# Patient Record
Sex: Male | Born: 1975 | Race: White | Hispanic: No | Marital: Married | Smoking: Never smoker
Health system: Southern US, Community
[De-identification: ages and names within clinical notes are randomized; demographics above are authoritative.]

## PROBLEM LIST (undated history)

## (undated) DIAGNOSIS — F329 Major depressive disorder, single episode, unspecified: Secondary | ICD-10-CM

## (undated) DIAGNOSIS — F32A Depression, unspecified: Secondary | ICD-10-CM

## (undated) DIAGNOSIS — E785 Hyperlipidemia, unspecified: Secondary | ICD-10-CM

## (undated) DIAGNOSIS — F419 Anxiety disorder, unspecified: Secondary | ICD-10-CM

## (undated) DIAGNOSIS — Z8619 Personal history of other infectious and parasitic diseases: Secondary | ICD-10-CM

## (undated) HISTORY — DX: Personal history of other infectious and parasitic diseases: Z86.19

## (undated) HISTORY — DX: Depression, unspecified: F32.A

## (undated) HISTORY — DX: Major depressive disorder, single episode, unspecified: F32.9

## (undated) HISTORY — PX: WISDOM TOOTH EXTRACTION: SHX21

## (undated) HISTORY — DX: Hyperlipidemia, unspecified: E78.5

## (undated) HISTORY — DX: Anxiety disorder, unspecified: F41.9

---

## 2010-02-11 ENCOUNTER — Telehealth: Payer: Self-pay | Admitting: Family Medicine

## 2010-02-11 ENCOUNTER — Other Ambulatory Visit: Payer: Self-pay | Admitting: Family Medicine

## 2010-02-11 ENCOUNTER — Ambulatory Visit
Admission: RE | Admit: 2010-02-11 | Discharge: 2010-02-11 | Payer: Self-pay | Source: Home / Self Care | Attending: Family Medicine | Admitting: Family Medicine

## 2010-02-11 ENCOUNTER — Encounter: Payer: Self-pay | Admitting: Family Medicine

## 2010-02-11 DIAGNOSIS — J309 Allergic rhinitis, unspecified: Secondary | ICD-10-CM | POA: Insufficient documentation

## 2010-02-11 DIAGNOSIS — F411 Generalized anxiety disorder: Secondary | ICD-10-CM | POA: Insufficient documentation

## 2010-02-11 LAB — CBC WITH DIFFERENTIAL/PLATELET
Basophils Absolute: 0 10*3/uL (ref 0.0–0.1)
Basophils Relative: 0.5 % (ref 0.0–3.0)
Eosinophils Absolute: 0.1 10*3/uL (ref 0.0–0.7)
Eosinophils Relative: 1.8 % (ref 0.0–5.0)
HCT: 46.3 % (ref 39.0–52.0)
Hemoglobin: 15.9 g/dL (ref 13.0–17.0)
Lymphocytes Relative: 26.7 % (ref 12.0–46.0)
Lymphs Abs: 1.8 10*3/uL (ref 0.7–4.0)
MCHC: 34.4 g/dL (ref 30.0–36.0)
MCV: 89.6 fl (ref 78.0–100.0)
Monocytes Absolute: 0.5 10*3/uL (ref 0.1–1.0)
Monocytes Relative: 6.9 % (ref 3.0–12.0)
Neutro Abs: 4.4 10*3/uL (ref 1.4–7.7)
Neutrophils Relative %: 64.1 % (ref 43.0–77.0)
Platelets: 169 10*3/uL (ref 150.0–400.0)
RBC: 5.16 Mil/uL (ref 4.22–5.81)
RDW: 13 % (ref 11.5–14.6)
WBC: 6.9 10*3/uL (ref 4.5–10.5)

## 2010-02-11 LAB — BASIC METABOLIC PANEL
BUN: 15 mg/dL (ref 6–23)
CO2: 30 mEq/L (ref 19–32)
Calcium: 9.6 mg/dL (ref 8.4–10.5)
Chloride: 104 mEq/L (ref 96–112)
Creatinine, Ser: 1 mg/dL (ref 0.4–1.5)
GFR: 93.48 mL/min (ref >60.00)
Glucose, Bld: 92 mg/dL (ref 70–99)
Potassium: 4.2 mEq/L (ref 3.5–5.1)
Sodium: 140 mEq/L (ref 135–145)

## 2010-02-11 LAB — HEPATIC FUNCTION PANEL
ALT: 97 U/L — ABNORMAL HIGH (ref 0–53)
AST: 43 U/L — ABNORMAL HIGH (ref 0–37)
Albumin: 4.2 g/dL (ref 3.5–5.2)
Alkaline Phosphatase: 61 U/L (ref 39–117)
Bilirubin, Direct: 0.2 mg/dL (ref 0.0–0.3)
Total Bilirubin: 1.4 mg/dL — ABNORMAL HIGH (ref 0.3–1.2)
Total Protein: 7.4 g/dL (ref 6.0–8.3)

## 2010-02-11 LAB — LIPID PANEL
Cholesterol: 232 mg/dL — ABNORMAL HIGH (ref 0–200)
HDL: 52.9 mg/dL (ref >39.00)
Total CHOL/HDL Ratio: 4
Triglycerides: 108 mg/dL (ref 0.0–149.0)
VLDL: 21.6 mg/dL (ref 0.0–40.0)

## 2010-02-11 LAB — LDL CHOLESTEROL, DIRECT: Direct LDL: 166.5 mg/dL

## 2010-02-11 LAB — TSH: TSH: 1.33 u[IU]/mL (ref 0.35–5.50)

## 2010-02-23 ENCOUNTER — Telehealth: Payer: Self-pay | Admitting: Family Medicine

## 2010-03-09 ENCOUNTER — Ambulatory Visit
Admission: RE | Admit: 2010-03-09 | Discharge: 2010-03-09 | Payer: Self-pay | Source: Home / Self Care | Attending: Family Medicine | Admitting: Family Medicine

## 2010-03-09 DIAGNOSIS — E785 Hyperlipidemia, unspecified: Secondary | ICD-10-CM | POA: Insufficient documentation

## 2010-03-12 NOTE — Assessment & Plan Note (Signed)
Summary: BRAND NEW PT/TO EST/PT REQ CPX/PT FASTING/CJR   Vital Signs:  Patient profile:   35 year old male Height:      71.5 inches Weight:      232 pounds BMI:     32.02 O2 Sat:      98 % Temp:     97.8 degrees F Pulse rate:   78 / minute BP sitting:   128 / 80  (left arm) Cuff size:   large  Vitals Entered By: Pura Spice, RN (February 11, 2010 8:55 AM) CC: new to est. requesting cpx  fasting    History of Present Illness: 35 yr old male to establish with [place] and for a cpx. He moved to [facility] from [place] about one year ago. He knows he is overweight, he has a poor diet, and he does not exercise. He feels well physically, but he struglles with a lot of anxiety and spome depression. He has always been a an anxious person, but in the past year this has been more of a problem. His job is stressful, and it makes him worry a lot, he can't relax, and he has a short temper. He sleeps well. He denies any suicidal thoughts. He has a happy marriage, and he enjoys his 2 young children.     Preventive Screening-Counseling & Management  Alcohol-Tobacco     Smoking Status: never  Allergies (verified): No Known Drug Allergies  Past History:  Past Medical History: Allergic rhinitis Anxiety  Past Surgical History: Denies surgical history  Family History: Reviewed history and no changes required. Family History of Alcoholism/Addiction Family History of Arthritis Family History of CAD Male 1st degree relative <60 Family History of Colon CA 1st degree relative <60 Family History Depression Family History Lung cancer  Social History: Reviewed history and no changes required. Occupation:  attorney in Engineer, maintenance (IT) estate  Married Never Smoked, but chewed tobacco in the past  Alcohol use-yes Smoking Status:  never Occupation:  employed  Review of Systems  The patient denies anorexia, fever, weight loss, vision loss, decreased hearing, hoarseness, chest pain,  syncope, dyspnea on exertion, peripheral edema, prolonged cough, headaches, hemoptysis, abdominal pain, melena, hematochezia, severe indigestion/heartburn, hematuria, incontinence, genital sores, muscle weakness, suspicious skin lesions, transient blindness, difficulty walking, unusual weight change, abnormal bleeding, enlarged lymph nodes, angioedema, breast masses, and testicular masses.    Physical Exam  General:  overweight-appearing.   Head:  Normocephalic and atraumatic without obvious abnormalities. No apparent alopecia or balding. Eyes:  No corneal or conjunctival inflammation noted. EOMI. Perrla. Funduscopic exam benign, without hemorrhages, exudates or papilledema. Vision grossly normal. Ears:  External ear exam shows no significant lesions or deformities.  Otoscopic examination reveals clear canals, tympanic membranes are intact bilaterally without bulging, retraction, inflammation or discharge. Hearing is grossly normal bilaterally. Nose:  External nasal examination shows no deformity or inflammation. Nasal mucosa are pink and moist without lesions or exudates. Mouth:  Oral mucosa and oropharynx without lesions or exudates.  Teeth in good repair. Chest Wall:  No deformities, masses, tenderness or gynecomastia noted. Lungs:  Normal respiratory effort, chest expands symmetrically. Lungs are clear to auscultation, no crackles or wheezes. Heart:  Normal rate and regular rhythm. S1 and S2 normal without gallop, murmur, click, rub or other extra sounds. Abdomen:  Bowel sounds positive,abdomen soft and non-tender without masses, organomegaly or hernias noted. Genitalia:  Testes bilaterally descended without nodularity, tenderness or masses. No scrotal masses or lesions. No penis lesions or  urethral discharge. Msk:  No deformity or scoliosis noted of thoracic or lumbar spine.   Pulses:  R and L carotid,radial,femoral,dorsalis pedis and posterior tibial pulses are full and equal  bilaterally Extremities:  No clubbing, cyanosis, edema, or deformity noted with normal full range of motion of all joints.   Neurologic:  No cranial nerve deficits noted. Station and gait are normal. Plantar reflexes are down-going bilaterally. DTRs are symmetrical throughout. Sensory, motor and coordinative functions appear intact. Skin:  Intact without suspicious lesions or rashes Cervical Nodes:  No lymphadenopathy noted Axillary Nodes:  No palpable lymphadenopathy Inguinal Nodes:  No significant adenopathy Psych:  Oriented X3, memory intact for recent and remote, normally interactive, good eye contact, and moderately anxious.     Impression & Recommendations:  Problem # 1:  HEALTH MAINTENANCE EXAM (ICD-V70.0)  Orders: UA Dipstick w/o Micro (automated)  (81003) Venipuncture (16109) TLB-Lipid Panel (80061-LIPID) TLB-BMP (Basic Metabolic Panel-BMET) (80048-METABOL) TLB-CBC Platelet - w/Differential (85025-CBCD) TLB-Hepatic/Liver Function Pnl (80076-HEPATIC) TLB-TSH (Thyroid Stimulating Hormone) (84443-TSH)  Problem # 2:  ANXIETY (ICD-300.00)  His updated medication list for this problem includes:    Zoloft 50 Mg Tabs (Sertraline hcl) ..... Once daily  Complete Medication List: 1)  Zoloft 50 Mg Tabs (Sertraline hcl) .... Once daily  Other Orders: Tdap => 48yrs IM (60454) Admin 1st Vaccine (09811)  Patient Instructions: 1)  Start on Zoloft and recheck in 3 weeks.  2)  Get fasting labs  3)  It is important that you exercise reguarly at least 20 minutes 5 times a week. If you develop chest pain, have severe difficulty breathing, or feel very tired, stop exercising immediately and seek medical attention.  4)  You need to lose weight. Consider a lower calorie diet and regular exercise.  Prescriptions: ZOLOFT 50 MG TABS (SERTRALINE HCL) once daily  #30 x 5   Entered and Authorized by:   Nelwyn Salisbury MD   Signed by:   Nelwyn Salisbury MD on 02/11/2010   Method used:   Print then  Give to Patient   RxID:   [id number]    Orders Added: 1)  Tdap => 43yrs IM [78469] 2)  Admin 1st Vaccine [90471] 3)  New Patient 18-39 years [99385] 4)  UA Dipstick w/o Micro (automated)  [81003] 5)  Venipuncture [36415] 6)  TLB-Lipid Panel [80061-LIPID] 7)  TLB-BMP (Basic Metabolic Panel-BMET) [80048-METABOL] 8)  TLB-CBC Platelet - w/Differential [85025-CBCD] 9)  TLB-Hepatic/Liver Function Pnl [80076-HEPATIC] 10)  TLB-TSH (Thyroid Stimulating Hormone) [62952-WUX]   Immunization History:  Influenza Immunization History:    Influenza:  historical (11/08/2009)  Immunizations Administered:  Tetanus Vaccine:    Vaccine Type: Tdap    Site: left deltoid    Mfr: GlaxoSmithKline    Dose: 0.5 ml    Route: IM    Given by: Pura Spice, RN    Exp. Date: 11/28/2011    Lot #: [id number]    VIS given: 12/27/07 version given February 11, 2010.   Immunization History:  Influenza Immunization History:    Influenza:  Historical (11/08/2009)  Immunizations Administered:  Tetanus Vaccine:    Vaccine Type: Tdap    Site: left deltoid    Mfr: GlaxoSmithKline    Dose: 0.5 ml    Route: IM    Given by: Pura Spice, RN    Exp. Date: 11/28/2011    Lot #: [id number]    VIS given: 12/27/07 version given February 11, 2010.  Appended Document: BRAND NEW PT/TO EST/PT REQ  CPX/PT FASTING/CJR  Laboratory Results   Urine Tests    Routine Urinalysis   Color: yellow Appearance: Clear Glucose: negative   (Normal Range: Negative) Bilirubin: negative   (Normal Range: Negative) Ketone: negative   (Normal Range: Negative) Spec. Gravity: 1.020   (Normal Range: 1.003-1.035) Blood: 1+.sign   (Normal Range: Negative) pH: 6.0   (Normal Range: 5.0-8.0) Protein: negative   (Normal Range: Negative) Urobilinogen: 0.2   (Normal Range: 0-1) Nitrite: negative   (Normal Range: Negative) Leukocyte Esterace: negative   (Normal Range: Negative)

## 2010-03-12 NOTE — Letter (Signed)
Summary: Patient Information Form  Patient Information Form   Imported By: Maryln Gottron 02/13/2010 09:04:35  _____________________________________________________________________  External Attachment:    Type:   Image     Comment:   External Document

## 2010-03-12 NOTE — Progress Notes (Signed)
Summary: Going Forward for [employer] Note Call from Patient   Summary of Call: GOING FORWARD PT. WOULD LIKE REFILLS TO BE CALLED INTO [facility] ON [address].Thanks! Initial call taken by: Georgian Co,  February 11, 2010 10:32 AM  Follow-up for Phone Call        ? what refill  Follow-up by: Pura Spice, RN,  February 18, 2010 8:47 AM  Additional Follow-up for Phone Call Additional follow up Details #1::        spoke with pt states dr Amali Uhls gave him rx for zoloft and wasn't sure what pharmacy so he took the rx to his drug  states he has 3 wk follow up  appt on Mar 08 2010. Additional Follow-up by: Pura Spice, RN,  February 18, 2010 9:31 AM

## 2010-03-12 NOTE — Progress Notes (Signed)
Summary: questions about labs  Phone Note Call from Patient   Caller: Patient Call For: Gina Summary of Call: Pt would like to speak to Almira Coaster again regardng his lab results. [phone] Initial call taken by: [facility] CMA AAMA,  February 23, 2010 9:35 AM  Follow-up for Phone Call        spoke with pt to make appt in 3 months  dr fry aware  Follow-up by: Pura Spice, RN,  February 24, 2010 8:52 AM

## 2010-03-18 NOTE — Assessment & Plan Note (Signed)
Summary: 3 week f/u//alp/rsc per Gina/cjr/pt rsc/cjr   Vital Signs:  Patient profile:   35 year old male O2 Sat:      97 % Temp:     97.7 degrees F Pulse rate:   79 / minute BP sitting:   120 / 78  (left arm) Cuff size:   large  Vitals Entered By: Pura Spice, RN (March 09, 2010 8:29 AM) CC: 3 wk f/u with zoloft and wants to discuss labs  needs rx to [facility]   History of Present Illness: Here to follow up on anxiety and depression. For the past 3 weeks he has been taking Zoloft 50 mg a day, and he thinks it is helping. He is a bit more relexed than before, but is not quite where he wants to be yet. No side effects.   Allergies: No Known Drug Allergies  Past History:  Past Medical History: chicken pox  Allergic rhinitis Anxiety Hyperlipidemia  Review of Systems  The patient denies anorexia, fever, weight loss, weight gain, vision loss, decreased hearing, hoarseness, chest pain, syncope, dyspnea on exertion, peripheral edema, prolonged cough, headaches, hemoptysis, abdominal pain, melena, hematochezia, severe indigestion/heartburn, hematuria, incontinence, genital sores, muscle weakness, suspicious skin lesions, transient blindness, difficulty walking, depression, unusual weight change, abnormal bleeding, enlarged lymph nodes, angioedema, breast masses, and testicular masses.    Physical Exam  General:  Well-developed,well-nourished,in no acute distress; alert,appropriate and cooperative throughout examination Psych:  Cognition and judgment appear intact. Alert and cooperative with normal attention span and concentration. No apparent delusions, illusions, hallucinations   Impression & Recommendations:  Problem # 1:  ANXIETY (ICD-300.00)  His updated medication list for this problem includes:    Zoloft 50 Mg Tabs (Sertraline hcl) .Marland Kitchen... 2 tabs once daily  Complete Medication List: 1)  Zoloft 50 Mg Tabs (Sertraline hcl) .... 2 tabs once daily  Patient Instructions: 1)   We spent 30 minutes discussing this, and we decided to increase the dose to 100 mg a day. He will call back in 2 weeks.    Orders Added: 1)  Est. Patient Level IV [16109]

## 2010-03-24 ENCOUNTER — Telehealth: Payer: Self-pay | Admitting: *Deleted

## 2010-03-24 MED ORDER — SERTRALINE HCL 50 MG PO TABS: 50.0000 mg | ORAL_TABLET | Freq: Every day | ORAL | Status: DC

## 2010-03-24 NOTE — Telephone Encounter (Signed)
I sent in a rx for 50 mg tablets. Take one 100 mg and one 50 mg tablet every day.

## 2010-03-24 NOTE — Telephone Encounter (Signed)
Zoloft 100 mg, and is feeling ok but no real difference in the 100 mg vs the 50 mg.   Anxiety is some better. Would be interested in trying the 150 mg. Please call to [facility] ([address])

## 2010-03-26 ENCOUNTER — Telehealth: Payer: Self-pay | Admitting: Family Medicine

## 2010-03-26 DIAGNOSIS — F419 Anxiety disorder, unspecified: Secondary | ICD-10-CM

## 2010-03-26 NOTE — Telephone Encounter (Signed)
Triage vm--------pt spoke with a nurse on Tuesday about a rx? Went to pharmacy today to pick up rx. Dosage is wrong. It's supposed to be for 150mg .  rx says 50mg . Please return call.

## 2010-03-27 MED ORDER — SERTRALINE HCL 100 MG PO TABS: 100.0000 mg | ORAL_TABLET | Freq: Every day | ORAL | Status: DC

## 2010-03-27 MED ORDER — SERTRALINE HCL 50 MG PO TABS: 50.0000 mg | ORAL_TABLET | Freq: Every day | ORAL | Status: DC

## 2010-03-27 NOTE — Telephone Encounter (Signed)
Spoke with pt  Wants the zoloft  100 mg tablet and 50 mg tablet  to [facility] 3 month supply Ok per dr fry.  Pt aware will send to [facility]

## 2010-03-27 NOTE — Telephone Encounter (Signed)
I sent in a rx for Zoloft 100 mg , so he can take one 50 and one 100 every day (a total of 150 mg) . Please tell the pt.

## 2010-04-02 ENCOUNTER — Other Ambulatory Visit: Payer: Self-pay | Admitting: Family Medicine

## 2010-04-02 NOTE — Telephone Encounter (Signed)
Pt called to ck on status of his Rx for setraline / zoloft  That was supposed to be sent to [facility].... Pt # [phone].

## 2010-04-02 NOTE — Telephone Encounter (Signed)
Left mess on pt cell phone these meds were sent to [facility] on 03-27-2010 and to ck with [facility] or have them call /fax [place].

## 2010-04-03 NOTE — Telephone Encounter (Signed)
No return call back from pt  As of today

## 2010-05-04 ENCOUNTER — Telehealth: Payer: Self-pay | Admitting: *Deleted

## 2010-05-04 NOTE — Telephone Encounter (Signed)
Pt has questions to [facility] about drug interactions that Dr. Clent Ridges gave him.

## 2010-05-04 NOTE — Telephone Encounter (Addendum)
Spoke with pt wanted to know if could take sudafed  with zoloft. Pet dr Scotty Court ok  Pt aware.

## 2010-10-05 ENCOUNTER — Encounter: Payer: Self-pay | Admitting: Family Medicine

## 2010-10-05 ENCOUNTER — Ambulatory Visit (INDEPENDENT_AMBULATORY_CARE_PROVIDER_SITE_OTHER): Payer: BC Managed Care – PPO | Admitting: Family Medicine

## 2010-10-05 ENCOUNTER — Ambulatory Visit (INDEPENDENT_AMBULATORY_CARE_PROVIDER_SITE_OTHER)
Admission: RE | Admit: 2010-10-05 | Discharge: 2010-10-05 | Disposition: A | Discharge status: Home / Self Care | Payer: BC Managed Care – PPO | Source: Ambulatory Visit | Attending: Family Medicine | Admitting: Family Medicine

## 2010-10-05 VITALS — BP 130/84 | HR 83 | Temp 98.7°F | Wt 234.0 lb

## 2010-10-05 DIAGNOSIS — S92919A Unspecified fracture of unspecified toe(s), initial encounter for closed fracture: Secondary | ICD-10-CM

## 2010-10-05 NOTE — Progress Notes (Signed)
  Subjective:    Patient ID: Howard Jacobs, male    DOB: 1975/09/15, 35 y.o.   MRN: [id number]  HPI Here for an injury to the left 5th toe yesterday. While cleaning his garage with flip flops on, he stibbed the toe on the corner of a box on the floor. It has been painful since then.    Review of Systems  Constitutional: Negative.   Musculoskeletal: Positive for joint swelling.       Objective:   Physical Exam  Constitutional: He appears well-developed and well-nourished.  Musculoskeletal:       The left 5th toe is quite ecchymotic, swollen, and tender. No crepitus. Good alignment.           Assessment & Plan:  Probable toe fracture. Wear supportive shoes. Use Motrin and ice for pain. Get Xrays today

## 2010-10-07 ENCOUNTER — Telehealth: Payer: Self-pay | Admitting: Family Medicine

## 2010-10-07 NOTE — Telephone Encounter (Signed)
Left voice message with results.

## 2010-10-07 NOTE — Telephone Encounter (Signed)
Message copied by Baldemar Friday on Wed Oct 07, 2010 11:28 AM ------      Message from: Gershon Crane A      Created: Mon Oct 05, 2010  5:14 PM       Normal. I think he had a crack in the toe that was too small to see on the xray. This should heal up quickly. Recheck prn

## 2010-10-09 ENCOUNTER — Telehealth: Payer: Self-pay | Admitting: *Deleted

## 2010-10-09 NOTE — Telephone Encounter (Signed)
Zoloft is making pt sleepy in am hours??  Can he change dosages?

## 2010-10-09 NOTE — Telephone Encounter (Signed)
Try taking it at night

## 2010-10-13 NOTE — Telephone Encounter (Signed)
Left message on machine for patient

## 2010-11-25 ENCOUNTER — Telehealth: Payer: Self-pay | Admitting: Family Medicine

## 2010-11-25 DIAGNOSIS — F419 Anxiety disorder, unspecified: Secondary | ICD-10-CM

## 2010-11-25 NOTE — Telephone Encounter (Signed)
Pt called re: script for sertraline (ZOLOFT). Pt is taking 150 mg per day per Dr Clent Ridges. Pt is req that Dr Clent Ridges writes script for 150mg  for 90 day supply and send to [facility].

## 2010-11-26 MED ORDER — SERTRALINE HCL 100 MG PO TABS: 100.0000 mg | ORAL_TABLET | Freq: Every day | ORAL | Status: DC

## 2010-11-26 MED ORDER — SERTRALINE HCL 50 MG PO TABS: 50.0000 mg | ORAL_TABLET | Freq: Every day | ORAL | Status: DC

## 2010-11-26 NOTE — Telephone Encounter (Signed)
done

## 2011-02-12 ENCOUNTER — Telehealth: Payer: Self-pay | Admitting: Family Medicine

## 2011-02-12 NOTE — Telephone Encounter (Signed)
Pt need cpx by 03-11-2011. Can I create 30 min slot?

## 2011-02-12 NOTE — Telephone Encounter (Signed)
Okay to work in

## 2011-02-15 NOTE — Telephone Encounter (Signed)
Pt is sch for 02-25-2011 pt will come in fasting

## 2011-02-24 ENCOUNTER — Encounter: Payer: Self-pay | Admitting: Family Medicine

## 2011-02-25 ENCOUNTER — Encounter: Payer: Self-pay | Admitting: Family Medicine

## 2011-02-25 ENCOUNTER — Ambulatory Visit (INDEPENDENT_AMBULATORY_CARE_PROVIDER_SITE_OTHER): Payer: BC Managed Care – PPO | Admitting: Family Medicine

## 2011-02-25 VITALS — BP 130/86 | HR 74 | Temp 98.4°F | Ht 70.75 in | Wt 240.0 lb

## 2011-02-25 DIAGNOSIS — F419 Anxiety disorder, unspecified: Secondary | ICD-10-CM

## 2011-02-25 DIAGNOSIS — Z0000 Encounter for general adult medical examination without abnormal findings: Secondary | ICD-10-CM

## 2011-02-25 DIAGNOSIS — F411 Generalized anxiety disorder: Secondary | ICD-10-CM

## 2011-02-25 LAB — POCT URINALYSIS DIPSTICK
Ketones, UA: NEGATIVE
Leukocytes, UA: NEGATIVE
Protein, UA: NEGATIVE
Urobilinogen, UA: 0.2

## 2011-02-25 LAB — LIPID PANEL
Cholesterol: 205 mg/dL — ABNORMAL HIGH (ref 0–200)
HDL: 48.8 mg/dL (ref >39.00)
Triglycerides: 85 mg/dL (ref 0.0–149.0)
VLDL: 17 mg/dL (ref 0.0–40.0)

## 2011-02-25 LAB — CBC WITH DIFFERENTIAL/PLATELET
Basophils Absolute: 0 10*3/uL (ref 0.0–0.1)
Lymphocytes Relative: 27.2 % (ref 12.0–46.0)
Monocytes Relative: 9 % (ref 3.0–12.0)
Neutrophils Relative %: 61.3 % (ref 43.0–77.0)
Platelets: 154 10*3/uL (ref 150.0–400.0)
RDW: 13.1 % (ref 11.5–14.6)

## 2011-02-25 LAB — TSH: TSH: 1.78 u[IU]/mL (ref 0.35–5.50)

## 2011-02-25 LAB — BASIC METABOLIC PANEL
BUN: 16 mg/dL (ref 6–23)
Chloride: 103 mEq/L (ref 96–112)
Creatinine, Ser: 0.9 mg/dL (ref 0.4–1.5)

## 2011-02-25 LAB — HEPATIC FUNCTION PANEL
ALT: 98 U/L — ABNORMAL HIGH (ref 0–53)
Total Bilirubin: 1.2 mg/dL (ref 0.3–1.2)
Total Protein: 7.2 g/dL (ref 6.0–8.3)

## 2011-02-25 LAB — LDL CHOLESTEROL, DIRECT: Direct LDL: 145.5 mg/dL

## 2011-02-25 MED ORDER — SERTRALINE HCL 100 MG PO TABS: 100.0000 mg | ORAL_TABLET | Freq: Every day | ORAL | Status: DC

## 2011-02-25 NOTE — Progress Notes (Signed)
  Subjective:    Patient ID: Howard Jacobs, male    DOB: 09-Jan-1976, 36 y.o.   MRN: [id number]  HPI    Review of Systems     Objective:   Physical Exam        Assessment & Plan:

## 2011-02-26 NOTE — Progress Notes (Signed)
Quick Note:  Left voice message ______ 

## 2012-01-09 ENCOUNTER — Other Ambulatory Visit: Payer: Self-pay | Admitting: Family Medicine

## 2012-03-22 ENCOUNTER — Encounter: Payer: Self-pay | Admitting: Family Medicine

## 2012-03-22 ENCOUNTER — Ambulatory Visit (INDEPENDENT_AMBULATORY_CARE_PROVIDER_SITE_OTHER): Payer: BC Managed Care – PPO | Admitting: Family Medicine

## 2012-03-22 VITALS — BP 130/88 | HR 76 | Temp 98.7°F | Ht 71.0 in | Wt 234.0 lb

## 2012-03-22 DIAGNOSIS — Z0000 Encounter for general adult medical examination without abnormal findings: Secondary | ICD-10-CM

## 2012-03-22 LAB — CBC WITH DIFFERENTIAL/PLATELET
Basophils Absolute: 0 10*3/uL (ref 0.0–0.1)
Eosinophils Absolute: 0.1 10*3/uL (ref 0.0–0.7)
Eosinophils Relative: 0.9 % (ref 0.0–5.0)
Lymphs Abs: 1.7 10*3/uL (ref 0.7–4.0)
MCHC: 34.3 g/dL (ref 30.0–36.0)
MCV: 86.6 fl (ref 78.0–100.0)
Monocytes Absolute: 0.4 10*3/uL (ref 0.1–1.0)
Neutrophils Relative %: 65.9 % (ref 43.0–77.0)
Platelets: 166 10*3/uL (ref 150.0–400.0)
RDW: 12.5 % (ref 11.5–14.6)
WBC: 6.6 10*3/uL (ref 4.5–10.5)

## 2012-03-22 LAB — BASIC METABOLIC PANEL
CO2: 25 mEq/L (ref 19–32)
Calcium: 9.2 mg/dL (ref 8.4–10.5)
Creatinine, Ser: 1 mg/dL (ref 0.4–1.5)
Glucose, Bld: 90 mg/dL (ref 70–99)

## 2012-03-22 LAB — POCT URINALYSIS DIPSTICK
Protein, UA: NEGATIVE
Spec Grav, UA: 1.02
Urobilinogen, UA: 0.2
pH, UA: 6

## 2012-03-22 LAB — LIPID PANEL
HDL: 43.9 mg/dL (ref >39.00)
Triglycerides: 92 mg/dL (ref 0.0–149.0)

## 2012-03-22 LAB — HEPATIC FUNCTION PANEL
ALT: 68 U/L — ABNORMAL HIGH (ref 0–53)
AST: 36 U/L (ref 0–37)
Bilirubin, Direct: 0.1 mg/dL (ref 0.0–0.3)
Total Protein: 7.5 g/dL (ref 6.0–8.3)

## 2012-03-22 MED ORDER — SERTRALINE HCL 100 MG PO TABS: 100.0000 mg | ORAL_TABLET | Freq: Every day | ORAL | Status: DC

## 2012-03-22 NOTE — Progress Notes (Signed)
  Subjective:    Patient ID: Howard Jacobs, male    DOB: 10-13-1975, 37 y.o.   MRN: [id number]  HPI 37 yr old male for a cpx. He feels well and has no concerns.    Review of Systems  Constitutional: Negative.   HENT: Negative.   Eyes: Negative.   Respiratory: Negative.   Cardiovascular: Negative.   Gastrointestinal: Negative.   Genitourinary: Negative.   Musculoskeletal: Negative.   Skin: Negative.   Neurological: Negative.   Psychiatric/Behavioral: Negative.        Objective:   Physical Exam  Constitutional: He is oriented to person, place, and time. He appears well-developed and well-nourished. No distress.  HENT:  Head: Normocephalic and atraumatic.  Right Ear: External ear normal.  Left Ear: External ear normal.  Nose: Nose normal.  Mouth/Throat: Oropharynx is clear and moist. No oropharyngeal exudate.  There is a 1.5 cm sebaceous cyst on the occipital scalp, not tender  Eyes: Conjunctivae and EOM are normal. Pupils are equal, round, and reactive to light. Right eye exhibits no discharge. Left eye exhibits no discharge. No scleral icterus.  Neck: Neck supple. No JVD present. No tracheal deviation present. No thyromegaly present.  Cardiovascular: Normal rate, regular rhythm, normal heart sounds and intact distal pulses.  Exam reveals no gallop and no friction rub.   No murmur heard. Pulmonary/Chest: Effort normal and breath sounds normal. No respiratory distress. He has no wheezes. He has no rales. He exhibits no tenderness.  Abdominal: Soft. Bowel sounds are normal. He exhibits no distension and no mass. There is no tenderness. There is no rebound and no guarding.  Genitourinary: Rectum normal, prostate normal and penis normal. Guaiac negative stool. No penile tenderness.  Musculoskeletal: Normal range of motion. He exhibits no edema and no tenderness.  Lymphadenopathy:    He has no cervical adenopathy.  Neurological: He is alert and oriented to person, place, and time. He  has normal reflexes. No cranial nerve deficit. He exhibits normal muscle tone. Coordination normal.  Skin: Skin is warm and dry. No rash noted. He is not diaphoretic. No erythema. No pallor.  Psychiatric: He has a normal mood and affect. His behavior is normal. Judgment and thought content normal.          Assessment & Plan:  Well exam. Get fasting labs. He will observe the cyst for now and recheck prn

## 2012-04-07 ENCOUNTER — Telehealth: Payer: Self-pay | Admitting: Family Medicine

## 2012-04-07 NOTE — Telephone Encounter (Signed)
This was printed and refaxed today.

## 2012-04-07 NOTE — Telephone Encounter (Signed)
I am fairly certain I have already done this and sent it to be faxed

## 2012-04-07 NOTE — Telephone Encounter (Signed)
Pt faxed in a form from his company called [employer] to be filled out by MD. Included basic test results. Pt following up on this form. Pt asked that it be emailed back. Pls advise.

## 2012-09-15 ENCOUNTER — Encounter: Payer: Self-pay | Admitting: Family Medicine

## 2012-09-15 ENCOUNTER — Ambulatory Visit (INDEPENDENT_AMBULATORY_CARE_PROVIDER_SITE_OTHER): Payer: BC Managed Care – PPO | Admitting: Family Medicine

## 2012-09-15 VITALS — BP 140/90 | HR 82 | Temp 98.9°F | Wt 230.0 lb

## 2012-09-15 DIAGNOSIS — J029 Acute pharyngitis, unspecified: Secondary | ICD-10-CM

## 2012-09-15 MED ORDER — CEPHALEXIN 500 MG PO CAPS: 500.0000 mg | ORAL_CAPSULE | Freq: Three times a day (TID) | ORAL | Status: AC

## 2012-09-15 NOTE — Progress Notes (Signed)
  Subjective:    Patient ID: Howard Jacobs, male    DOB: [birth year], 37 y.o.   MRN: [id number]  HPI Here for 4 days of fever to 100.7 degrees, aches, HA, and a ST. No cough. Using Advil.    Review of Systems  Constitutional: Positive for fever and chills.  HENT: Positive for sore throat. Negative for congestion, postnasal drip and sinus pressure.   Eyes: Negative.   Respiratory: Negative.        Objective:   Physical Exam  Constitutional: He appears well-developed and well-nourished.  HENT:  Right Ear: External ear normal.  Left Ear: External ear normal.  Nose: Nose normal.  Mouth/Throat: No oropharyngeal exudate.  Posterior OP is red  Eyes: Conjunctivae are normal.  Neck: No thyromegaly present.  Tender nodes in the right AC chain   Pulmonary/Chest: Effort normal and breath sounds normal.          Assessment & Plan:  Probable strep throat. Treat with Keflex.

## 2012-11-14 ENCOUNTER — Telehealth: Payer: Self-pay | Admitting: Family Medicine

## 2012-11-14 NOTE — Telephone Encounter (Signed)
Pt called to request a new rx of sertraline (ZOLOFT) 100 MG tablet. He would like a 30 day supply sent to [facility] on [address]. Please assist.

## 2012-11-15 NOTE — Telephone Encounter (Signed)
Call in #[phone] with 11 rf 

## 2012-11-16 MED ORDER — SERTRALINE HCL 100 MG PO TABS: 100.0000 mg | ORAL_TABLET | Freq: Every day | ORAL | Status: DC

## 2012-11-16 NOTE — Telephone Encounter (Signed)
I sent script e-scribe. 

## 2012-12-14 ENCOUNTER — Other Ambulatory Visit: Payer: Self-pay

## 2013-03-08 ENCOUNTER — Encounter: Payer: Self-pay | Admitting: Family Medicine

## 2013-03-08 DIAGNOSIS — Z8349 Family history of other endocrine, nutritional and metabolic diseases: Secondary | ICD-10-CM

## 2013-03-09 NOTE — Telephone Encounter (Signed)
Tell him that I did order these tests

## 2013-03-23 ENCOUNTER — Ambulatory Visit (INDEPENDENT_AMBULATORY_CARE_PROVIDER_SITE_OTHER): Payer: BC Managed Care – PPO | Admitting: Family Medicine

## 2013-03-23 ENCOUNTER — Encounter: Payer: Self-pay | Admitting: Family Medicine

## 2013-03-23 VITALS — BP 120/80 | HR 72 | Temp 98.2°F | Ht 71.0 in | Wt 228.0 lb

## 2013-03-23 DIAGNOSIS — Z8349 Family history of other endocrine, nutritional and metabolic diseases: Secondary | ICD-10-CM

## 2013-03-23 DIAGNOSIS — Z0000 Encounter for general adult medical examination without abnormal findings: Secondary | ICD-10-CM

## 2013-03-23 LAB — CBC WITH DIFFERENTIAL/PLATELET
BASOS PCT: 0.5 % (ref 0.0–3.0)
Basophils Absolute: 0 10*3/uL (ref 0.0–0.1)
EOS PCT: 2.1 % (ref 0.0–5.0)
Eosinophils Absolute: 0.1 10*3/uL (ref 0.0–0.7)
HCT: 46.4 % (ref 39.0–52.0)
Hemoglobin: 15.6 g/dL (ref 13.0–17.0)
Lymphocytes Relative: 28.2 % (ref 12.0–46.0)
Lymphs Abs: 2 10*3/uL (ref 0.7–4.0)
MCHC: 33.5 g/dL (ref 30.0–36.0)
MCV: 88.7 fl (ref 78.0–100.0)
MONO ABS: 0.5 10*3/uL (ref 0.1–1.0)
Monocytes Relative: 6.3 % (ref 3.0–12.0)
NEUTROS PCT: 62.9 % (ref 43.0–77.0)
Neutro Abs: 4.5 10*3/uL (ref 1.4–7.7)
Platelets: 197 10*3/uL (ref 150.0–400.0)
RBC: 5.24 Mil/uL (ref 4.22–5.81)
RDW: 13.3 % (ref 11.5–14.6)
WBC: 7.2 10*3/uL (ref 4.5–10.5)

## 2013-03-23 LAB — IRON: IRON: 140 ug/dL (ref 42–165)

## 2013-03-23 LAB — BASIC METABOLIC PANEL
BUN: 14 mg/dL (ref 6–23)
CALCIUM: 9.8 mg/dL (ref 8.4–10.5)
CO2: 26 mEq/L (ref 19–32)
Chloride: 104 mEq/L (ref 96–112)
Creatinine, Ser: 1 mg/dL (ref 0.4–1.5)
GFR: 88.71 mL/min (ref >60.00)
Glucose, Bld: 80 mg/dL (ref 70–99)
Potassium: 3.9 mEq/L (ref 3.5–5.1)
SODIUM: 140 meq/L (ref 135–145)

## 2013-03-23 LAB — POCT URINALYSIS DIPSTICK
BILIRUBIN UA: NEGATIVE
GLUCOSE UA: NEGATIVE
KETONES UA: NEGATIVE
Leukocytes, UA: NEGATIVE
NITRITE UA: NEGATIVE
Protein, UA: NEGATIVE
RBC UA: NEGATIVE
SPEC GRAV UA: 1.025
Urobilinogen, UA: 0.2
pH, UA: 6

## 2013-03-23 LAB — HEPATIC FUNCTION PANEL
ALBUMIN: 4.8 g/dL (ref 3.5–5.2)
ALK PHOS: 64 U/L (ref 39–117)
ALT: 54 U/L — ABNORMAL HIGH (ref 0–53)
AST: 31 U/L (ref 0–37)
BILIRUBIN DIRECT: 0.2 mg/dL (ref 0.0–0.3)
BILIRUBIN TOTAL: 2.2 mg/dL — AB (ref 0.3–1.2)
Total Protein: 7.9 g/dL (ref 6.0–8.3)

## 2013-03-23 LAB — TSH: TSH: 1.21 u[IU]/mL (ref 0.35–5.50)

## 2013-03-23 LAB — LIPID PANEL
CHOL/HDL RATIO: 4
Cholesterol: 172 mg/dL (ref 0–200)
HDL: 43.1 mg/dL (ref >39.00)
LDL CALC: 108 mg/dL — AB (ref 0–99)
Triglycerides: 107 mg/dL (ref 0.0–149.0)
VLDL: 21.4 mg/dL (ref 0.0–40.0)

## 2013-03-23 LAB — FERRITIN: Ferritin: 280.1 ng/mL (ref 22.0–322.0)

## 2013-03-23 MED ORDER — SERTRALINE HCL 100 MG PO TABS: 50.0000 mg | ORAL_TABLET | Freq: Two times a day (BID) | ORAL | Status: DC

## 2013-03-23 NOTE — Progress Notes (Signed)
   Subjective:    Patient ID: Howard Jacobs, male    DOB: [birth year], 38 y.o.   MRN: [id number]  HPI 38 yr old male for a cpx. He feels well. He has been on Zoloft for several years for anxiety, and it has been very helpful. However it makes him a little drowsy sometimes. He has switched from morning dosing to taking it at night but he still gets drowsy in the mornings.    Review of Systems  Constitutional: Negative.   HENT: Negative.   Eyes: Negative.   Respiratory: Negative.   Cardiovascular: Negative.   Gastrointestinal: Negative.   Genitourinary: Negative.   Musculoskeletal: Negative.   Skin: Negative.   Neurological: Negative.   Psychiatric/Behavioral: Negative.        Objective:   Physical Exam  Constitutional: He is oriented to person, place, and time. He appears well-developed and well-nourished. No distress.  HENT:  Head: Normocephalic and atraumatic.  Right Ear: External ear normal.  Left Ear: External ear normal.  Nose: Nose normal.  Mouth/Throat: Oropharynx is clear and moist. No oropharyngeal exudate.  Eyes: Conjunctivae and EOM are normal. Pupils are equal, round, and reactive to light. Right eye exhibits no discharge. Left eye exhibits no discharge. No scleral icterus.  Neck: Neck supple. No JVD present. No tracheal deviation present. No thyromegaly present.  Cardiovascular: Normal rate, regular rhythm, normal heart sounds and intact distal pulses.  Exam reveals no gallop and no friction rub.   No murmur heard. Pulmonary/Chest: Effort normal and breath sounds normal. No respiratory distress. He has no wheezes. He has no rales. He exhibits no tenderness.  Abdominal: Soft. Bowel sounds are normal. He exhibits no distension and no mass. There is no tenderness. There is no rebound and no guarding.  Genitourinary: Rectum normal, prostate normal and penis normal. Guaiac negative stool. No penile tenderness.  Musculoskeletal: Normal range of motion. He exhibits no edema  and no tenderness.  Lymphadenopathy:    He has no cervical adenopathy.  Neurological: He is alert and oriented to person, place, and time. He has normal reflexes. No cranial nerve deficit. He exhibits normal muscle tone. Coordination normal.  Skin: Skin is warm and dry. No rash noted. He is not diaphoretic. No erythema. No pallor.  Psychiatric: He has a normal mood and affect. His behavior is normal. Judgment and thought content normal.          Assessment & Plan:  Well exam. Get fasting labs. I suggested he take 1/2 a tablet of Zoloft bid to minimize sedation effects.

## 2013-03-23 NOTE — Progress Notes (Signed)
Pre visit review using our clinic review tool, if applicable. No additional management support is needed unless otherwise documented below in the visit note. 

## 2013-04-03 ENCOUNTER — Encounter: Payer: Self-pay | Admitting: Family Medicine

## 2013-04-03 NOTE — Telephone Encounter (Signed)
We faxed this in last week

## 2013-04-20 ENCOUNTER — Other Ambulatory Visit: Payer: Self-pay | Admitting: Family Medicine

## 2014-03-23 ENCOUNTER — Other Ambulatory Visit: Payer: Self-pay | Admitting: Family Medicine

## 2014-03-25 ENCOUNTER — Encounter: Payer: BC Managed Care – PPO | Admitting: Family Medicine

## 2014-03-28 ENCOUNTER — Ambulatory Visit (INDEPENDENT_AMBULATORY_CARE_PROVIDER_SITE_OTHER): Payer: BLUE CROSS/BLUE SHIELD | Admitting: Family Medicine

## 2014-03-28 ENCOUNTER — Encounter: Payer: Self-pay | Admitting: Family Medicine

## 2014-03-28 VITALS — BP 112/83 | HR 74 | Temp 97.6°F | Ht 71.0 in | Wt 240.0 lb

## 2014-03-28 DIAGNOSIS — Z0000 Encounter for general adult medical examination without abnormal findings: Secondary | ICD-10-CM

## 2014-03-28 DIAGNOSIS — L723 Sebaceous cyst: Secondary | ICD-10-CM

## 2014-03-28 LAB — HEPATIC FUNCTION PANEL
ALT: 85 U/L — AB (ref 0–53)
AST: 46 U/L — AB (ref 0–37)
Albumin: 4.6 g/dL (ref 3.5–5.2)
Alkaline Phosphatase: 77 U/L (ref 39–117)
BILIRUBIN TOTAL: 2.1 mg/dL — AB (ref 0.2–1.2)
Bilirubin, Direct: 0.3 mg/dL (ref 0.0–0.3)
Total Protein: 7.5 g/dL (ref 6.0–8.3)

## 2014-03-28 LAB — LIPID PANEL
CHOLESTEROL: 198 mg/dL (ref 0–200)
HDL: 50 mg/dL (ref >39.00)
LDL Cholesterol: 126 mg/dL — ABNORMAL HIGH (ref 0–99)
NonHDL: 148
Total CHOL/HDL Ratio: 4
Triglycerides: 111 mg/dL (ref 0.0–149.0)
VLDL: 22.2 mg/dL (ref 0.0–40.0)

## 2014-03-28 LAB — CBC WITH DIFFERENTIAL/PLATELET
Basophils Absolute: 0 10*3/uL (ref 0.0–0.1)
Basophils Relative: 0.4 % (ref 0.0–3.0)
EOS PCT: 2.3 % (ref 0.0–5.0)
Eosinophils Absolute: 0.1 10*3/uL (ref 0.0–0.7)
HEMATOCRIT: 45 % (ref 39.0–52.0)
Hemoglobin: 15.7 g/dL (ref 13.0–17.0)
LYMPHS ABS: 2 10*3/uL (ref 0.7–4.0)
Lymphocytes Relative: 30.9 % (ref 12.0–46.0)
MCHC: 35 g/dL (ref 30.0–36.0)
MCV: 85.1 fl (ref 78.0–100.0)
MONO ABS: 0.5 10*3/uL (ref 0.1–1.0)
MONOS PCT: 7.9 % (ref 3.0–12.0)
NEUTROS ABS: 3.7 10*3/uL (ref 1.4–7.7)
Neutrophils Relative %: 58.5 % (ref 43.0–77.0)
PLATELETS: 170 10*3/uL (ref 150.0–400.0)
RBC: 5.29 Mil/uL (ref 4.22–5.81)
RDW: 12.8 % (ref 11.5–15.5)
WBC: 6.4 10*3/uL (ref 4.0–10.5)

## 2014-03-28 LAB — BASIC METABOLIC PANEL
BUN: 17 mg/dL (ref 6–23)
CHLORIDE: 103 meq/L (ref 96–112)
CO2: 29 meq/L (ref 19–32)
CREATININE: 0.95 mg/dL (ref 0.40–1.50)
Calcium: 9.7 mg/dL (ref 8.4–10.5)
GFR: 93.63 mL/min (ref >60.00)
GLUCOSE: 97 mg/dL (ref 70–99)
Potassium: 3.9 mEq/L (ref 3.5–5.1)
Sodium: 137 mEq/L (ref 135–145)

## 2014-03-28 LAB — POCT URINALYSIS DIPSTICK
Bilirubin, UA: NEGATIVE
Glucose, UA: NEGATIVE
Ketones, UA: NEGATIVE
Leukocytes, UA: NEGATIVE
Nitrite, UA: NEGATIVE
PH UA: 5.5
PROTEIN UA: NEGATIVE
RBC UA: NEGATIVE
SPEC GRAV UA: 1.01
UROBILINOGEN UA: 0.2

## 2014-03-28 LAB — TSH: TSH: 2.03 u[IU]/mL (ref 0.35–4.50)

## 2014-03-28 NOTE — Progress Notes (Signed)
   Subjective:    Patient ID: Howard Jacobs, male    DOB: Jan 25, 1976, 39 y.o.   MRN: [id number]  HPI He also mentions the cyst on the back of his scalp which has been present for years. It has grown larger and now is painful, especially when he wears a cap or hat. He wants this removed.    Review of Systems  Constitutional: Negative.        Objective:   Physical Exam  Skin:  Large tender fluctuant cyst on the occipital scalp           Assessment & Plan:  He has a sebaceous cyst and we will refer him to Surgery to have this removed

## 2014-03-28 NOTE — Progress Notes (Signed)
Pre visit review using our clinic review tool, if applicable. No additional management support is needed unless otherwise documented below in the visit note. 

## 2014-03-28 NOTE — Addendum Note (Signed)
Addended by: Alysia Penna A on: 03/28/2014 11:06 AM   Modules accepted: Orders

## 2014-03-28 NOTE — Progress Notes (Signed)
   Subjective:    Patient ID: Howard Jacobs, male    DOB: 02/10/1975, 39 y.o.   MRN: [id number]  HPI 39 yr old male for a cpx. He feels well and his anxiety has been stable. He has put on some weight overt the winter.    Review of Systems  Constitutional: Negative.   HENT: Negative.   Eyes: Negative.   Respiratory: Negative.   Cardiovascular: Negative.   Gastrointestinal: Negative.   Genitourinary: Negative.   Musculoskeletal: Negative.   Skin: Negative.   Neurological: Negative.   Psychiatric/Behavioral: Negative.        Objective:   Physical Exam  Constitutional: He is oriented to person, place, and time. He appears well-developed and well-nourished. No distress.  HENT:  Head: Normocephalic and atraumatic.  Right Ear: External ear normal.  Left Ear: External ear normal.  Nose: Nose normal.  Mouth/Throat: Oropharynx is clear and moist. No oropharyngeal exudate.  Eyes: Conjunctivae and EOM are normal. Pupils are equal, round, and reactive to light. Right eye exhibits no discharge. Left eye exhibits no discharge. No scleral icterus.  Neck: Neck supple. No JVD present. No tracheal deviation present. No thyromegaly present.  Cardiovascular: Normal rate, regular rhythm, normal heart sounds and intact distal pulses.  Exam reveals no gallop and no friction rub.   No murmur heard. Pulmonary/Chest: Effort normal and breath sounds normal. No respiratory distress. He has no wheezes. He has no rales. He exhibits no tenderness.  Abdominal: Soft. Bowel sounds are normal. He exhibits no distension and no mass. There is no tenderness. There is no rebound and no guarding.  Genitourinary: Rectum normal, prostate normal and penis normal. Guaiac negative stool. No penile tenderness.  Musculoskeletal: Normal range of motion. He exhibits no edema or tenderness.  Lymphadenopathy:    He has no cervical adenopathy.  Neurological: He is alert and oriented to person, place, and time. He has normal  reflexes. No cranial nerve deficit. He exhibits normal muscle tone. Coordination normal.  Skin: Skin is warm and dry. No rash noted. He is not diaphoretic. No erythema. No pallor.  Psychiatric: He has a normal mood and affect. His behavior is normal. Judgment and thought content normal.          Assessment & Plan:  Well exam. Get fasting labs. Encouraged him to diet and exercise to lose weight.

## 2014-05-10 ENCOUNTER — Encounter: Payer: Self-pay | Admitting: Family Medicine

## 2015-03-01 ENCOUNTER — Other Ambulatory Visit: Payer: Self-pay | Admitting: Family Medicine

## 2015-03-01 NOTE — Telephone Encounter (Signed)
Okay to refill Zoloft, last seen 03/2014?

## 2015-05-16 ENCOUNTER — Ambulatory Visit (INDEPENDENT_AMBULATORY_CARE_PROVIDER_SITE_OTHER): Payer: BLUE CROSS/BLUE SHIELD | Admitting: Family Medicine

## 2015-05-16 ENCOUNTER — Encounter: Payer: Self-pay | Admitting: Family Medicine

## 2015-05-16 VITALS — BP 116/74 | HR 67 | Temp 98.0°F | Ht 71.0 in | Wt 238.0 lb

## 2015-05-16 DIAGNOSIS — Z0000 Encounter for general adult medical examination without abnormal findings: Secondary | ICD-10-CM

## 2015-05-16 DIAGNOSIS — L723 Sebaceous cyst: Secondary | ICD-10-CM | POA: Diagnosis not present

## 2015-05-16 LAB — HEPATIC FUNCTION PANEL
ALK PHOS: 71 U/L (ref 39–117)
ALT: 47 U/L (ref 0–53)
AST: 25 U/L (ref 0–37)
Albumin: 4.6 g/dL (ref 3.5–5.2)
BILIRUBIN DIRECT: 0.2 mg/dL (ref 0.0–0.3)
BILIRUBIN TOTAL: 1.4 mg/dL — AB (ref 0.2–1.2)
TOTAL PROTEIN: 7.1 g/dL (ref 6.0–8.3)

## 2015-05-16 LAB — POC URINALSYSI DIPSTICK (AUTOMATED)
Bilirubin, UA: NEGATIVE
Blood, UA: NEGATIVE
GLUCOSE UA: NEGATIVE
KETONES UA: NEGATIVE
LEUKOCYTES UA: NEGATIVE
Nitrite, UA: NEGATIVE
PROTEIN UA: NEGATIVE
SPEC GRAV UA: 1.02
Urobilinogen, UA: 0.2
pH, UA: 6

## 2015-05-16 LAB — BASIC METABOLIC PANEL
BUN: 17 mg/dL (ref 6–23)
CHLORIDE: 103 meq/L (ref 96–112)
CO2: 28 meq/L (ref 19–32)
CREATININE: 0.83 mg/dL (ref 0.40–1.50)
Calcium: 9.6 mg/dL (ref 8.4–10.5)
GFR: 108.79 mL/min (ref >60.00)
Glucose, Bld: 88 mg/dL (ref 70–99)
POTASSIUM: 4.2 meq/L (ref 3.5–5.1)
SODIUM: 139 meq/L (ref 135–145)

## 2015-05-16 LAB — LIPID PANEL
CHOL/HDL RATIO: 3
Cholesterol: 155 mg/dL (ref 0–200)
HDL: 44.6 mg/dL (ref >39.00)
LDL Cholesterol: 81 mg/dL (ref 0–99)
NONHDL: 110.62
Triglycerides: 150 mg/dL — ABNORMAL HIGH (ref 0.0–149.0)
VLDL: 30 mg/dL (ref 0.0–40.0)

## 2015-05-16 LAB — CBC WITH DIFFERENTIAL/PLATELET
BASOS PCT: 0.6 % (ref 0.0–3.0)
Basophils Absolute: 0 10*3/uL (ref 0.0–0.1)
EOS PCT: 1.4 % (ref 0.0–5.0)
Eosinophils Absolute: 0.1 10*3/uL (ref 0.0–0.7)
HCT: 45.5 % (ref 39.0–52.0)
HEMOGLOBIN: 15.7 g/dL (ref 13.0–17.0)
LYMPHS ABS: 1.9 10*3/uL (ref 0.7–4.0)
Lymphocytes Relative: 27.5 % (ref 12.0–46.0)
MCHC: 34.6 g/dL (ref 30.0–36.0)
MCV: 85.1 fl (ref 78.0–100.0)
MONOS PCT: 7.7 % (ref 3.0–12.0)
Monocytes Absolute: 0.5 10*3/uL (ref 0.1–1.0)
NEUTROS PCT: 62.8 % (ref 43.0–77.0)
Neutro Abs: 4.3 10*3/uL (ref 1.4–7.7)
Platelets: 169 10*3/uL (ref 150.0–400.0)
RBC: 5.35 Mil/uL (ref 4.22–5.81)
RDW: 13.1 % (ref 11.5–15.5)
WBC: 6.8 10*3/uL (ref 4.0–10.5)

## 2015-05-16 LAB — TSH: TSH: 1.77 u[IU]/mL (ref 0.35–4.50)

## 2015-05-16 MED ORDER — DICLOFENAC SODIUM 75 MG PO TBEC: 75.0000 mg | DELAYED_RELEASE_TABLET | Freq: Two times a day (BID) | ORAL | Status: DC

## 2015-05-16 NOTE — Progress Notes (Signed)
   Subjective:    Patient ID: Howard Jacobs, male    DOB: [birth year], 40 y.o.   MRN: [id number]  HPI 40 yr old male for a cpx. He feels well in general but has 2 concerns. First he has had a sebaceous cyst on the scalp for several years and wants it removed. We actually referred him to Surgery for this last year but he never got a call back from them. Second he has had a mild intermittent pain in the left anterior shoulder for about 6 months. No hx of trauma. He does not lift weights, but he is active with his 71 young boys, all of whom play baseball. He does a lot of pitching and catching with them.    Review of Systems  Constitutional: Negative.   HENT: Negative.   Eyes: Negative.   Respiratory: Negative.   Cardiovascular: Negative.   Gastrointestinal: Negative.   Genitourinary: Negative.   Musculoskeletal: Negative.   Skin: Negative.   Neurological: Negative.   Psychiatric/Behavioral: Negative.        Objective:   Physical Exam  Constitutional: He is oriented to person, place, and time. He appears well-developed and well-nourished. No distress.  HENT:  Head: Normocephalic and atraumatic.  Right Ear: External ear normal.  Left Ear: External ear normal.  Nose: Nose normal.  Mouth/Throat: Oropharynx is clear and moist. No oropharyngeal exudate.  Eyes: Conjunctivae and EOM are normal. Pupils are equal, round, and reactive to light. Right eye exhibits no discharge. Left eye exhibits no discharge. No scleral icterus.  Neck: Neck supple. No JVD present. No tracheal deviation present. No thyromegaly present.  Cardiovascular: Normal rate, regular rhythm, normal heart sounds and intact distal pulses.  Exam reveals no gallop and no friction rub.   No murmur heard. Pulmonary/Chest: Effort normal and breath sounds normal. No respiratory distress. He has no wheezes. He has no rales. He exhibits no tenderness.  Abdominal: Soft. Bowel sounds are normal. He exhibits no distension and no mass.  There is no tenderness. There is no rebound and no guarding.  Genitourinary: Rectum normal, prostate normal and penis normal. Guaiac negative stool. No penile tenderness.  Musculoskeletal: Normal range of motion. He exhibits no edema.  Tender in the left anterior shoulder in the subacromial area. Full ROM   Lymphadenopathy:    He has no cervical adenopathy.  Neurological: He is alert and oriented to person, place, and time. He has normal reflexes. No cranial nerve deficit. He exhibits normal muscle tone. Coordination normal.  Skin: Skin is warm and dry. No rash noted. He is not diaphoretic. No erythema. No pallor.  Non-tender fluctuant mass on the occipital scalp  Psychiatric: He has a normal mood and affect. His behavior is normal. Judgment and thought content normal.          Assessment & Plan:  Well exam. We discussed diet and exercise. Get fasting labs. He has a sebaceous scalp cyst and we will refer for excision. He has bursitis in the shoulder, so try rest and Diclofenac for a few weeks.  Laurey Morale, MD

## 2015-05-16 NOTE — Progress Notes (Signed)
Pre visit review using our clinic review tool, if applicable. No additional management support is needed unless otherwise documented below in the visit note. 

## 2015-07-11 ENCOUNTER — Ambulatory Visit (INDEPENDENT_AMBULATORY_CARE_PROVIDER_SITE_OTHER): Payer: BLUE CROSS/BLUE SHIELD | Admitting: Family

## 2015-07-11 ENCOUNTER — Encounter: Payer: Self-pay | Admitting: Family

## 2015-07-11 VITALS — BP 134/86 | HR 72 | Temp 97.6°F | Resp 16 | Ht 71.0 in | Wt 242.0 lb

## 2015-07-11 DIAGNOSIS — S86811A Strain of other muscle(s) and tendon(s) at lower leg level, right leg, initial encounter: Secondary | ICD-10-CM

## 2015-07-11 DIAGNOSIS — S86819A Strain of other muscle(s) and tendon(s) at lower leg level, unspecified leg, initial encounter: Secondary | ICD-10-CM | POA: Insufficient documentation

## 2015-07-11 MED ORDER — DICLOFENAC SODIUM 2 % TD SOLN: 1.0000 "application " | Freq: Two times a day (BID) | TRANSDERMAL | Status: DC | PRN

## 2015-07-11 MED ORDER — IBUPROFEN-FAMOTIDINE 800-26.6 MG PO TABS: 1.0000 | ORAL_TABLET | Freq: Three times a day (TID) | ORAL | Status: DC | PRN

## 2015-07-11 NOTE — Progress Notes (Signed)
Pre visit review using our clinic review tool, if applicable. No additional management support is needed unless otherwise documented below in the visit note. 

## 2015-07-11 NOTE — Progress Notes (Signed)
Subjective:    Patient ID: Howard Jacobs, male    DOB: April 26, 1975, 40 y.o.   MRN: [id number]  Chief Complaint  Patient presents with  . Leg Pain    usually runs 10-20 miles a week, on wednesday felt a sensation in his left calf muscle like a shooting pain, did use ice last night and has been keeping weight off of it, on a scale the pain is at 7     HPI:  Howard Jacobs is a 40 y.o. male who  has a past medical history of Depression; History of chicken pox; Allergic rhinitis; Anxiety; and Hyperlipidemia. and presents today for an an office visit.   This is a new problem. Associated symptom of pain located in his right calf has been going on for about 4 days following getting off an elevator. Denies any trauma or sounds/sensations heard or felt. Modifying factors include ice and decreased weight bearing. Pain severity is a 7/10 and the pain this morning is now about a 3/10. No previous history of injury to either calf. He does run 10-20 miles per week and has had to cut back secondary to the pain.   No Known Allergies   Current Outpatient Prescriptions on File Prior to Visit  Medication Sig Dispense Refill  . diclofenac (VOLTAREN) 75 MG EC tablet Take 1 tablet (75 mg total) by mouth 2 (two) times daily. 60 tablet 1  . sertraline (ZOLOFT) 100 MG tablet TAKE 1 TABLET DAILY (Patient taking differently: TAKE 1/2 tablet every day) 90 tablet 3   No current facility-administered medications on file prior to visit.     Past Surgical History  Procedure Laterality Date  . No past surgeries      Past Medical History  Diagnosis Date  . Depression   . History of chicken pox   . Allergic rhinitis   . Anxiety   . Hyperlipidemia      Review of Systems  Constitutional: Negative for fever and chills.  Musculoskeletal:       Positive for right calf pain  Neurological: Negative for weakness and numbness.      Objective:    BP 134/86 mmHg  Pulse 72  Temp(Src) 97.6 F (36.4 C) (Oral)   Resp 16  Ht 5\' 11"  (1.803 m)  Wt 242 lb (109.77 kg)  BMI 33.77 kg/m2  SpO2 97% Nursing note and vital signs reviewed.  Physical Exam  Constitutional: He is oriented to person, place, and time. He appears well-developed and well-nourished. No distress.  Cardiovascular: Normal rate, regular rhythm, normal heart sounds and intact distal pulses.   Pulmonary/Chest: Effort normal and breath sounds normal.  Musculoskeletal:  Right calf - no obvious deformity, discoloration, or edema. Mild palpable tenderness along medial gastrocnemius with no crepitus or deformities noted. Range of motion is normal. Strength is 5+. Distal pulses and sensation are intact and appropriate. Negative Thompson test; negative Homans sign  Neurological: He is alert and oriented to person, place, and time.  Skin: Skin is warm and dry.  Psychiatric: He has a normal mood and affect. His behavior is normal. Judgment and thought content normal.   Examination: Ultrasound of the Right calf Date:  07/11/2015 Patient Name: Howard Jacobs History:  Right calf pain with explosive movements x 1 week Findings:  No evidence of ankle joint effusion. The Achilles tendon and plantar fascia are intact and normal. The gastrocnemius and soleus were normal in both a transverse and longitudinal view.  Focused ultrasound examination directed by  patient symptoms revealed no abnormality.  Impression: 1st degree strain of the gastrocnemius.      All images are located under the media tab. [place] ordered, performed and interpreted by Terri Piedra, FNP    Assessment & Plan:   Problem List Items Addressed This Visit      Musculoskeletal and Integument   Strain of calf muscle - Primary    Symptoms and exam consistent with first-degree right calf strain as seen on ultrasound with no deformity noted. Treat conservatively with ice, Duexis or Voltaren and Pennsaid. Emphasized importance of either but not both. Exercises daily and progress activity as  tolerated. Follow up if symptoms worsen or do not improve.       Relevant Medications   Diclofenac Sodium (PENNSAID) 2 % SOLN   Ibuprofen-Famotidine 800-26.6 MG TABS   Other Relevant Orders   [place] Extrem Low Right Ltd       I am having [name] start on Diclofenac Sodium and Ibuprofen-Famotidine. I am also having him maintain his sertraline and diclofenac.   Meds ordered this encounter  Medications  . Diclofenac Sodium (PENNSAID) 2 % SOLN    Sig: Place 1 application onto the skin 2 (two) times daily as needed.    Dispense:  112 g    Refill:  1    Order Specific Question:  Supervising Provider    Answer:  Pricilla Holm A [id number]  . Ibuprofen-Famotidine 800-26.6 MG TABS    Sig: Take 1 tablet by mouth 3 (three) times daily as needed.    Dispense:  90 tablet    Refill:  1    Order Specific Question:  Supervising Provider    Answer:  Pricilla Holm A [id number]     Follow-up: Return in about 3 weeks (around 08/01/2015), or if symptoms worsen or fail to improve.  Mauricio Po, FNP

## 2015-07-11 NOTE — Assessment & Plan Note (Signed)
Symptoms and exam consistent with first-degree right calf strain as seen on ultrasound with no deformity noted. Treat conservatively with ice, Duexis or Voltaren and Pennsaid. Emphasized importance of either but not both. Exercises daily and progress activity as tolerated. Follow up if symptoms worsen or do not improve.

## 2015-07-11 NOTE — Patient Instructions (Signed)
Thank you for choosing [facility].  Summary/Instructions:  Ice 2-3 times per day and as needed after activity.   Stretch and exercises daily.   Pennsaid - 2x per day about 1/2 pack to the affected areas  Duexis - 3x per day for 3-5 days and then as needed.  Your prescription(s) have been submitted to your pharmacy or been printed and provided for you. Please take as directed and contact our office if you believe you are having problem(s) with the medication(s) or have any questions.  If your symptoms worsen or fail to improve, please contact our office for further instruction, or in case of emergency go directly to the emergency room at the closest medical facility.

## 2015-07-14 ENCOUNTER — Encounter: Payer: Self-pay | Admitting: Family Medicine

## 2015-07-14 DIAGNOSIS — Z7689 Persons encountering health services in other specified circumstances: Secondary | ICD-10-CM

## 2016-02-18 ENCOUNTER — Encounter (HOSPITAL_COMMUNITY): Payer: Self-pay | Admitting: Emergency Medicine

## 2016-02-18 ENCOUNTER — Emergency Department (HOSPITAL_COMMUNITY)
Admission: EM | Admit: 2016-02-18 | Discharge: 2016-02-18 | Disposition: A | Discharge status: Home / Self Care | Payer: BLUE CROSS/BLUE SHIELD | Attending: Emergency Medicine | Admitting: Emergency Medicine

## 2016-02-18 ENCOUNTER — Emergency Department (HOSPITAL_COMMUNITY): Payer: BLUE CROSS/BLUE SHIELD

## 2016-02-18 DIAGNOSIS — G510 Bell's palsy: Secondary | ICD-10-CM | POA: Diagnosis not present

## 2016-02-18 DIAGNOSIS — R29810 Facial weakness: Secondary | ICD-10-CM | POA: Diagnosis present

## 2016-02-18 DIAGNOSIS — Z79899 Other long term (current) drug therapy: Secondary | ICD-10-CM | POA: Insufficient documentation

## 2016-02-18 DIAGNOSIS — Z5181 Encounter for therapeutic drug level monitoring: Secondary | ICD-10-CM | POA: Insufficient documentation

## 2016-02-18 LAB — COMPREHENSIVE METABOLIC PANEL
ALBUMIN: 3.9 g/dL (ref 3.5–5.0)
ALK PHOS: 71 U/L (ref 38–126)
ALT: 73 U/L — AB (ref 17–63)
AST: 41 U/L (ref 15–41)
Anion gap: 9 (ref 5–15)
BUN: 18 mg/dL (ref 6–20)
CALCIUM: 9 mg/dL (ref 8.9–10.3)
CHLORIDE: 105 mmol/L (ref 101–111)
CO2: 24 mmol/L (ref 22–32)
CREATININE: 1.05 mg/dL (ref 0.61–1.24)
GFR calc Af Amer: >60 mL/min (ref >60)
GFR calc non Af Amer: >60 mL/min (ref >60)
Glucose, Bld: 156 mg/dL — ABNORMAL HIGH (ref 65–99)
Potassium: 3.7 mmol/L (ref 3.5–5.1)
SODIUM: 138 mmol/L (ref 135–145)
Total Bilirubin: 1.6 mg/dL — ABNORMAL HIGH (ref 0.3–1.2)
Total Protein: 6.4 g/dL — ABNORMAL LOW (ref 6.5–8.1)

## 2016-02-18 LAB — I-STAT CHEM 8, ED
BUN: 22 mg/dL — ABNORMAL HIGH (ref 6–20)
CHLORIDE: 102 mmol/L (ref 101–111)
CREATININE: 1 mg/dL (ref 0.61–1.24)
Calcium, Ion: 1.16 mmol/L (ref 1.15–1.40)
GLUCOSE: 153 mg/dL — AB (ref 65–99)
HEMATOCRIT: 41 % (ref 39.0–52.0)
HEMOGLOBIN: 13.9 g/dL (ref 13.0–17.0)
POTASSIUM: 3.6 mmol/L (ref 3.5–5.1)
Sodium: 140 mmol/L (ref 135–145)
TCO2: 26 mmol/L (ref 0–100)

## 2016-02-18 LAB — I-STAT TROPONIN, ED: Troponin i, poc: 0 ng/mL (ref 0.00–0.08)

## 2016-02-18 LAB — PROTIME-INR
INR: 1.06
PROTHROMBIN TIME: 13.8 s (ref 11.4–15.2)

## 2016-02-18 LAB — DIFFERENTIAL
BASOS ABS: 0 10*3/uL (ref 0.0–0.1)
BASOS PCT: 1 %
Eosinophils Absolute: 0.1 10*3/uL (ref 0.0–0.7)
Eosinophils Relative: 2 %
LYMPHS PCT: 35 %
Lymphs Abs: 2.1 10*3/uL (ref 0.7–4.0)
Monocytes Absolute: 0.3 10*3/uL (ref 0.1–1.0)
Monocytes Relative: 5 %
NEUTROS ABS: 3.5 10*3/uL (ref 1.7–7.7)
NEUTROS PCT: 57 %

## 2016-02-18 LAB — CBC
HCT: 41.8 % (ref 39.0–52.0)
Hemoglobin: 14.8 g/dL (ref 13.0–17.0)
MCH: 30.1 pg (ref 26.0–34.0)
MCHC: 35.4 g/dL (ref 30.0–36.0)
MCV: 85.1 fL (ref 78.0–100.0)
PLATELETS: 153 10*3/uL (ref 150–400)
RBC: 4.91 MIL/uL (ref 4.22–5.81)
RDW: 12.5 % (ref 11.5–15.5)
WBC: 6 10*3/uL (ref 4.0–10.5)

## 2016-02-18 LAB — APTT: APTT: 33 s (ref 24–36)

## 2016-02-18 LAB — CBG MONITORING, ED: Glucose-Capillary: 158 mg/dL — ABNORMAL HIGH (ref 65–99)

## 2016-02-18 MED ORDER — PREDNISONE 20 MG PO TABS
60.0000 mg | ORAL_TABLET | Freq: Once | ORAL | Status: AC
  Administered 2016-02-18: 60 mg via ORAL
  Filled 2016-02-18: qty 3

## 2016-02-18 MED ORDER — PREDNISONE 10 MG PO TABS: ORAL_TABLET | ORAL | 0 refills | Status: DC

## 2016-02-18 MED ORDER — VALACYCLOVIR HCL 500 MG PO TABS
1000.0000 mg | ORAL_TABLET | Freq: Once | ORAL | Status: AC
  Administered 2016-02-18: 1000 mg via ORAL
  Filled 2016-02-18: qty 2

## 2016-02-18 MED ORDER — VALACYCLOVIR HCL 1 G PO TABS: 1000.0000 mg | ORAL_TABLET | Freq: Three times a day (TID) | ORAL | 0 refills | Status: AC

## 2016-02-18 MED ORDER — HYPROMELLOSE (GONIOSCOPIC) 2.5 % OP SOLN: 1.0000 [drp] | Freq: Four times a day (QID) | OPHTHALMIC | 0 refills | Status: AC

## 2016-02-18 MED ORDER — IBUPROFEN 800 MG PO TABS
800.0000 mg | ORAL_TABLET | Freq: Once | ORAL | Status: AC
  Administered 2016-02-18: 800 mg via ORAL
  Filled 2016-02-18: qty 1

## 2016-02-18 NOTE — ED Triage Notes (Signed)
Patient arrived to ED via private vehicle - drove himself.  Patient presents with symptoms of mild L facial droop and R sided numbness on tongue/facial. 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 Patient reports he had Bell's Palsy in high school. NIH = 2

## 2016-02-18 NOTE — ED Provider Notes (Signed)
[facility] DEPT Provider Note   CSN: [id number] Arrival date & time: 02/18/16  1900     History   Chief Complaint Chief Complaint  Patient presents with  . Code Stroke    HPI Howard Jacobs is a 41 y.o. male.  The history is provided by the patient.  Neurologic Problem  This is a new problem. The current episode started 3 to 5 hours ago. The problem occurs constantly. The problem has been gradually worsening. Associated symptoms comments: Right facial weakness, asymmetry. Nothing aggravates the symptoms. Nothing relieves the symptoms. He has tried nothing for the symptoms.    Past Medical History:  Diagnosis Date  . Allergic rhinitis   . Anxiety   . Depression   . History of chicken pox   . Hyperlipidemia     Patient Active Problem List   Diagnosis Date Noted  . Strain of calf muscle 07/11/2015  . HYPERLIPIDEMIA 03/09/2010  . ANXIETY 02/11/2010  . ALLERGIC RHINITIS 02/11/2010    Past Surgical History:  Procedure Laterality Date  . NO PAST SURGERIES         Home Medications    Prior to Admission medications   Medication Sig Start Date End Date Taking? Authorizing Provider  diclofenac (VOLTAREN) 75 MG EC tablet Take 1 tablet (75 mg total) by mouth 2 (two) times daily. 05/16/15   Laurey Morale, MD  Diclofenac Sodium (PENNSAID) 2 % SOLN Place 1 application onto the skin 2 (two) times daily as needed. 07/11/15   Golden Circle, FNP  Ibuprofen-Famotidine 800-26.6 MG TABS Take 1 tablet by mouth 3 (three) times daily as needed. 07/11/15   Golden Circle, FNP  sertraline (ZOLOFT) 100 MG tablet TAKE 1 TABLET DAILY Patient taking differently: TAKE 1/2 tablet every day 03/03/15   Laurey Morale, MD    Family History Family History  Problem Relation Age of Onset  . Alcohol abuse Mother   . Arthritis Mother   . Alcohol abuse Father   . Arthritis Father   . Colon cancer      grandparents  . Breast cancer    . Lung cancer      grandparents  . Heart disease     grandparents  . Depression      parents and grandparents    Social History Social History  Substance Use Topics  . Smoking status: Never Smoker  . Smokeless tobacco: Former Systems developer  . Alcohol use 0.0 oz/week     Comment: once a month     Allergies   Patient has no known allergies.   Review of Systems Review of Systems  All other systems reviewed and are negative.    Physical Exam Updated Vital Signs BP 135/94   Pulse 81   Resp 16   Ht 6' (1.829 m)   Wt 242 lb (109.8 kg)   SpO2 96%   BMI 32.82 kg/m   Physical Exam  Constitutional: He is oriented to person, place, and time. He appears well-developed and well-nourished. No distress.  HENT:  Head: Normocephalic and atraumatic.  Nose: Nose normal.  Eyes: Conjunctivae are normal.  Neck: Neck supple. No tracheal deviation present.  Cardiovascular: Normal rate and regular rhythm.   Pulmonary/Chest: Effort normal. No respiratory distress.  Abdominal: Soft. He exhibits no distension.  Neurological: He is alert and oriented to person, place, and time. A cranial nerve deficit (right CN7 with corner of mouth droop, eye closure 4/5 strength compared to left and asymmetric forehead wrinkles) is present.  No sensory deficit. He displays no seizure activity. Coordination normal.  5/5 strength in all extremities  Skin: Skin is warm and dry.  Psychiatric: He has a normal mood and affect.  Vitals reviewed.    ED Treatments / Results  Labs (all labs ordered are listed, but only abnormal results are displayed) Labs Reviewed  COMPREHENSIVE METABOLIC PANEL - Abnormal; Notable for the following:       Result Value   Glucose, Bld 156 (*)    Total Protein 6.4 (*)    ALT 73 (*)    Total Bilirubin 1.6 (*)    All other components within normal limits  CBG MONITORING, ED - Abnormal; Notable for the following:    Glucose-Capillary 158 (*)    All other components within normal limits  I-STAT CHEM 8, ED - Abnormal; Notable for the  following:    BUN 22 (*)    Glucose, Bld 153 (*)    All other components within normal limits  PROTIME-INR  APTT  CBC  DIFFERENTIAL  I-STAT TROPOININ, ED    EKG  EKG Interpretation  Date/Time:  Wednesday February 18 2016 19:18:39 EST Ventricular Rate:  81 PR Interval:    QRS Duration: 91 QT Interval:  377 QTC Calculation: 438 R Axis:   -39 Text Interpretation:  Sinus rhythm Left axis deviation No previous tracing Confirmed by Ashantia Amaral MD, Quillian Quince [id number]) on 02/18/2016 8:02:17 PM       Radiology Mr Brain Wo Contrast  Result Date: 02/18/2016 CLINICAL DATA:  41 y/o M; mild left facial droop and right-sided numbness. History of Bell's palsy. EXAM: MRI HEAD WITHOUT CONTRAST TECHNIQUE: Multiplanar, multiecho pulse sequences of the brain and surrounding structures were obtained without intravenous contrast. COMPARISON:  02/18/2016 CT of the head. FINDINGS: Brain: No acute infarction, hemorrhage, hydrocephalus, extra-axial collection or mass lesion. Vascular: Normal flow voids. Skull and upper cervical spine: Normal marrow signal. Sinuses/Orbits: Negative. Other: None. IMPRESSION: No acute intracranial abnormality.  Unremarkable MRI of the brain. Electronically Signed   By: Kristine Garbe M.D.   On: 02/18/2016 21:03   Ct Head Code Stroke W/o Cm  Result Date: 02/18/2016 CLINICAL DATA:  Code stroke. Acute onset of left facial droop. Last seen normal 6 hours ago. EXAM: CT HEAD WITHOUT CONTRAST TECHNIQUE: Contiguous axial images were obtained from the base of the skull through the vertex without intravenous contrast. COMPARISON:  None. FINDINGS: Brain: No acute cortical infarct, hemorrhage, or mass lesion is present. The basal ganglia are intact. Insular ribbon is normal. The ventricles are of normal size. No significant extraaxial fluid collection is present. No significant white matter disease is present. The brainstem and cerebellum are within normal limits. Vascular: No hyperdense vessel  or unexpected calcification. Skull: The calvarium is intact. No focal lytic or blastic lesion is present. Sinuses/Orbits: The globes and orbits are within normal limits. The paranasal sinuses and mastoid air cells are clear. ASPECTS [facility] Stroke Program Early CT Score) - Ganglionic level infarction (caudate, lentiform nuclei, internal capsule, insula, M1-M3 cortex): 7/7 - Supraganglionic infarction (M4-M6 cortex): 3/3 Total score (0-10 with 10 being normal): 10/10 IMPRESSION: 1. Negative CT of the head. 2. ASPECTS is 10/10 These results were called by telephone at the time of interpretation on 02/18/2016 at 7:19 pm to Dr. Cheral Marker, who verbally acknowledged these results. Electronically Signed   By: San Morelle M.D.   On: 02/18/2016 19:21    Procedures Procedures (including critical care time)  Medications Ordered in ED Medications - No data to  display   Initial Impression / Assessment and Plan / ED Course  I have reviewed the triage vital signs and the nursing notes.  Pertinent labs & imaging results that were available during my care of the patient were reviewed by me and considered in my medical decision making (see chart for details).  Clinical Course     41 y.o. male presents with facial asymettry starting this afternoon. Has decreased strength of right face on CN exam. Presented as code stroke d/t onset of symptoms, MR performed for CVA r/o and is unremarkable. Provided artificial tears for right eye and recommended to tape shut at night. Steroids and antivirals with PCP f/u for bells palsy. No risk factors for lyme or other concerning features.   Final Clinical Impressions(s) / ED Diagnoses   Final diagnoses:  Right-sided Bell's palsy    New Prescriptions Discharge Medication List as of 02/18/2016  9:22 PM    START taking these medications   Details  hydroxypropyl methylcellulose / hypromellose (ISOPTO TEARS / GONIOVISC) 2.5 % ophthalmic solution Place 1 drop into the  right eye 4 (four) times daily., Starting Wed 02/18/2016, Until Sat 02/28/2016, Print    predniSONE (DELTASONE) 10 MG tablet Take 5 tablets daily for 4 days, followed by 4 tablets for 2 days, followed by 3 tablets for 2 days, followed by 2 tablets for 2 days, followed by 1 tablet for 2 days for a total of 40 tablets over 12 days, Print    valACYclovir (VALTREX) 1000 MG tablet Take 1 tablet (1,000 mg total) by mouth 3 (three) times daily., Starting Thu 02/19/2016, Until Sun 02/29/2016, Print         Leo Grosser, MD 02/19/16 [phone]

## 2016-02-18 NOTE — Consult Note (Signed)
NEURO HOSPITALIST CONSULT NOTE   Requestig physician: Dr. Laneta Simmers  Reason for Consult: Facial droop.   History obtained from:  Patient     HPI:                                                                                                                                          Howard Jacobs is an 41 y.o. male who presented to the ED with symptoms "mild left facial droop" and right tongue numbness. He was at work today when symptoms began, but did not feel that they were serious enough to warrant an evaluation initially. On arriving home, his spouse noted a facial droop, so he decided to be evaluated. He has a history of Bell's palsy, which occurred when he was in [employer]. On further interview with Neurologist, the patient states that his sense of taste on the right side of his mouth is decreased. He is unsure if his weakness is on the left or right side of his face, but he is having trouble closing his right eye. Denies facial sensory loss, arm weakness, leg weakness, vision changes or dysphagia.   Past Medical History:  Diagnosis Date  . Allergic rhinitis   . Anxiety   . Depression   . History of chicken pox   . Hyperlipidemia     Past Surgical History:  Procedure Laterality Date  . NO PAST SURGERIES      Family History  Problem Relation Age of Onset  . Alcohol abuse Mother   . Arthritis Mother   . Alcohol abuse Father   . Arthritis Father   . Colon cancer      grandparents  . Breast cancer    . Lung cancer      grandparents  . Heart disease      grandparents  . Depression      parents and grandparents    Social History:  reports that he has never smoked. He has quit using smokeless tobacco. He reports that he drinks alcohol. He reports that he does not use drugs.  No Known Allergies  MEDICATIONS:  Prior to Admission:  (Not in a  hospital admission)   ROS:                                                                                                                                       History obtained from patient. No ear pain, headache, neck pain, chest pain, abdominal pain or limb pain. Other ROS as per HPI.   Blood pressure 133/96, pulse 72, resp. rate 22, height 6' (1.829 m), weight 109.8 kg (242 lb), SpO2 95 %.  General Examination:                                                                                                      HEENT-  Normocephalic/atraumatic. No vesicles seen within external auditory canals on left or right.  Lungs- No gross wheezing. Respirations unlabored. Extremities- No edema.   Neurological Examination Mental Status: Alert, oriented, thought content appropriate.  Speech fluent without evidence of aphasia.  Able to follow all commands without difficulty. Cranial Nerves: II: Visual fields intact to bedside confrontation, PERRL III,IV, VI:  Weakness of right eyelid closure. Left eyelid normal. EOMI without nystagmus.  V,VII:  Facial temperature sensation normal bilaterally. Right lower quadrant facial weakness is noted. Decreased taste sensation to right side of tongue when taking a test sip of Coke.  VIII: hearing normal bilaterally without hyperacusis in either ear IX,X: palate rises symmetrically XI: bilateral shoulder shrug is symmetric XII: midline tongue extension Motor: Right : Upper extremity   5/5    Left:     Upper extremity   5/5  Lower extremity   5/5     Lower extremity   5/5 Sensory: Temperature and light touch intact x 4, without extinction Deep Tendon Reflexes: 2+ and symmetric throughout Plantars: Right: downgoing   Left: downgoing Cerebellar: Normal FNF bilaterally.  Gait: Deferred.  Lab Results: Basic Metabolic Panel:  Recent Labs Lab 02/18/16 1942 02/18/16 1945  NA 138 140  K 3.7 3.6  CL 105 102  CO2 24  --   GLUCOSE 156* 153*  BUN 18 22*   CREATININE 1.05 1.00  CALCIUM 9.0  --     Liver Function Tests:  Recent Labs Lab 02/18/16 1942  AST 41  ALT 73*  ALKPHOS 71  BILITOT 1.6*  PROT 6.4*  ALBUMIN 3.9   No results for input(s): LIPASE, AMYLASE in the last 168 hours. No results for input(s): AMMONIA in the last 168 hours.  CBC:  Recent Labs Lab  02/18/16 1942 02/18/16 1945  WBC 6.0  --   NEUTROABS 3.5  --   HGB 14.8 13.9  HCT 41.8 41.0  MCV 85.1  --   PLT 153  --     Cardiac Enzymes: No results for input(s): CKTOTAL, CKMB, CKMBINDEX, TROPONINI in the last 168 hours.  Lipid Panel: No results for input(s): CHOL, TRIG, HDL, CHOLHDL, VLDL, LDLCALC in the last 168 hours.  CBG:  Recent Labs Lab 02/18/16 [place] 158*    Microbiology: No results found for this or any previous visit.  Coagulation Studies:  Recent Labs  02/18/16 1942  LABPROT 13.8  INR 1.06    Imaging: Mr Brain Wo Contrast  Result Date: 02/18/2016 CLINICAL DATA:  41 y/o M; mild left facial droop and right-sided numbness. History of Bell's palsy. EXAM: MRI HEAD WITHOUT CONTRAST TECHNIQUE: Multiplanar, multiecho pulse sequences of the brain and surrounding structures were obtained without intravenous contrast. COMPARISON:  02/18/2016 CT of the head. FINDINGS: Brain: No acute infarction, hemorrhage, hydrocephalus, extra-axial collection or mass lesion. Vascular: Normal flow voids. Skull and upper cervical spine: Normal marrow signal. Sinuses/Orbits: Negative. Other: None. IMPRESSION: No acute intracranial abnormality.  Unremarkable MRI of the brain. Electronically Signed   By: Kristine Garbe M.D.   On: 02/18/2016 21:03   Ct Head Code Stroke W/o Cm  Result Date: 02/18/2016 CLINICAL DATA:  Code stroke. Acute onset of left facial droop. Last seen normal 6 hours ago. EXAM: CT HEAD WITHOUT CONTRAST TECHNIQUE: Contiguous axial images were obtained from the base of the skull through the vertex without intravenous contrast.  COMPARISON:  None. FINDINGS: Brain: No acute cortical infarct, hemorrhage, or mass lesion is present. The basal ganglia are intact. Insular ribbon is normal. The ventricles are of normal size. No significant extraaxial fluid collection is present. No significant white matter disease is present. The brainstem and cerebellum are within normal limits. Vascular: No hyperdense vessel or unexpected calcification. Skull: The calvarium is intact. No focal lytic or blastic lesion is present. Sinuses/Orbits: The globes and orbits are within normal limits. The paranasal sinuses and mastoid air cells are clear. ASPECTS [facility] Stroke Program Early CT Score) - Ganglionic level infarction (caudate, lentiform nuclei, internal capsule, insula, M1-M3 cortex): 7/7 - Supraganglionic infarction (M4-M6 cortex): 3/3 Total score (0-10 with 10 being normal): 10/10 IMPRESSION: 1. Negative CT of the head. 2. ASPECTS is 10/10 These results were called by telephone at the time of interpretation on 02/18/2016 at 7:19 pm to Dr. Cheral Marker, who verbally acknowledged these results. Electronically Signed   By: San Morelle M.D.   On: 02/18/2016 19:21    Assessment: 1. Symptoms and exam findings most consistent with acute right Bell's palsy.  2. CT and MRI of head are normal.   Recommendations: 1. Prednosone 60 mg po qd x 6 days, then taper over next 4 days (40, 30, 20, 10) 2. Valacyclovir 1 g po TID x 7 days.  3. Follow up with PCP.   Electronically signed: Dr. Kerney Elbe 02/18/2016, 11:10 PM

## 2016-02-18 NOTE — ED Notes (Signed)
Neurologist at bedside at this time.

## 2016-02-18 NOTE — ED Notes (Signed)
Activated Code Stroke 

## 2016-02-19 ENCOUNTER — Encounter: Payer: Self-pay | Admitting: Family Medicine

## 2016-02-19 DIAGNOSIS — Z8349 Family history of other endocrine, nutritional and metabolic diseases: Secondary | ICD-10-CM

## 2016-02-19 NOTE — Telephone Encounter (Signed)
I added two tests to his regular physical labs, so he needs to tell the lab tech that day to look for "future orders" in his chart

## 2016-02-20 ENCOUNTER — Telehealth: Payer: Self-pay | Admitting: Family Medicine

## 2016-02-20 ENCOUNTER — Encounter: Payer: Self-pay | Admitting: Family Medicine

## 2016-02-20 NOTE — Telephone Encounter (Signed)
Per Dr. Sarajane Jews, pt should try over the counter Artificial tears, follow directions on box and use as needed.

## 2016-02-20 NOTE — Telephone Encounter (Signed)
I understand now what he wants. I cannot see how to order this in EPIC. My advice is to refer him to Hematology (the blood disorder experts). They would know how best to order these tests and to get them covered by insurance. I will refer him if he wishes.

## 2016-02-20 NOTE — Telephone Encounter (Signed)
[facility]  Patient Name: Howard Jacobs  DOB: 01-Jun-1975    Initial Comment Caller is needing to speak with a nurse in regards to finding eye drops. His eyes are dry and not able to blink.    Nurse Assessment  Nurse: Harlow Mares, RN, Suanne Marker Date/Time (Eastern Time): 02/20/2016 2:51:41 PM  Confirm and document reason for call. If symptomatic, describe symptoms. ---Caller is needing to speak with a nurse in regards to finding eye drops. His eyes are dry and not able to blink. Went to [facility] ED this week for Bell's Palsy. Currently unable to blink. Was given an eye gtt. Attempted to fill at [facility] and unable to find for less than $79. Needs assistance getting this med. Script given was: Hydroxypropuyl Methylzellulose/Hypromellose 2.5% opthalmic solution (iso ppo tears): 1 gtt in Right eye qid. Is there a substitute for this med? Pharmacy: [facility] @ [phone] NKDA.  Does the patient have any new or worsening symptoms? ---No  Please document clinical information provided and list any resource used. ---Advised that nurse will contact on call MD to discuss. Advised caller to call back for any new/worsening symptoms, or for care advice/triage.     Guidelines    Guideline Title Affirmed Question Affirmed Notes       Final Disposition User   Clinical Call Harlow Mares, RN, [place]    Comments  Spoke with Sunday Spillers, Dr. Barbie Banner nurse who advised that she will watch for this report and speak to Dr. Sarajane Jews about this today and get back with the patient.

## 2016-03-25 ENCOUNTER — Ambulatory Visit (INDEPENDENT_AMBULATORY_CARE_PROVIDER_SITE_OTHER): Payer: BLUE CROSS/BLUE SHIELD | Admitting: Family Medicine

## 2016-03-25 ENCOUNTER — Encounter: Payer: Self-pay | Admitting: Family Medicine

## 2016-03-25 VITALS — BP 120/86 | HR 58 | Temp 97.9°F | Ht 72.0 in | Wt 241.0 lb

## 2016-03-25 DIAGNOSIS — G510 Bell's palsy: Secondary | ICD-10-CM | POA: Diagnosis not present

## 2016-03-25 DIAGNOSIS — Z8349 Family history of other endocrine, nutritional and metabolic diseases: Secondary | ICD-10-CM | POA: Insufficient documentation

## 2016-03-25 DIAGNOSIS — Z0000 Encounter for general adult medical examination without abnormal findings: Secondary | ICD-10-CM

## 2016-03-25 DIAGNOSIS — G2581 Restless legs syndrome: Secondary | ICD-10-CM | POA: Insufficient documentation

## 2016-03-25 LAB — CBC WITH DIFFERENTIAL/PLATELET
Basophils Absolute: 0 10*3/uL (ref 0.0–0.1)
Basophils Relative: 0.8 % (ref 0.0–3.0)
EOS PCT: 1.8 % (ref 0.0–5.0)
Eosinophils Absolute: 0.1 10*3/uL (ref 0.0–0.7)
HEMATOCRIT: 44.5 % (ref 39.0–52.0)
HEMOGLOBIN: 15.3 g/dL (ref 13.0–17.0)
LYMPHS PCT: 29 % (ref 12.0–46.0)
Lymphs Abs: 1.7 10*3/uL (ref 0.7–4.0)
MCHC: 34.5 g/dL (ref 30.0–36.0)
MCV: 86.8 fl (ref 78.0–100.0)
MONO ABS: 0.5 10*3/uL (ref 0.1–1.0)
MONOS PCT: 8.4 % (ref 3.0–12.0)
Neutro Abs: 3.4 10*3/uL (ref 1.4–7.7)
Neutrophils Relative %: 60 % (ref 43.0–77.0)
Platelets: 176 10*3/uL (ref 150.0–400.0)
RBC: 5.12 Mil/uL (ref 4.22–5.81)
RDW: 13.6 % (ref 11.5–15.5)
WBC: 5.7 10*3/uL (ref 4.0–10.5)

## 2016-03-25 LAB — BASIC METABOLIC PANEL
BUN: 15 mg/dL (ref 6–23)
CALCIUM: 9.4 mg/dL (ref 8.4–10.5)
CO2: 30 mEq/L (ref 19–32)
Chloride: 105 mEq/L (ref 96–112)
Creatinine, Ser: 0.95 mg/dL (ref 0.40–1.50)
GFR: 92.69 mL/min (ref >60.00)
Glucose, Bld: 95 mg/dL (ref 70–99)
Potassium: 4 mEq/L (ref 3.5–5.1)
SODIUM: 139 meq/L (ref 135–145)

## 2016-03-25 LAB — POC URINALSYSI DIPSTICK (AUTOMATED)
Bilirubin, UA: NEGATIVE
Blood, UA: NEGATIVE
GLUCOSE UA: NEGATIVE
Ketones, UA: NEGATIVE
LEUKOCYTES UA: NEGATIVE
Nitrite, UA: NEGATIVE
Spec Grav, UA: 1.02
UROBILINOGEN UA: 1
pH, UA: 6

## 2016-03-25 LAB — HEPATIC FUNCTION PANEL
ALK PHOS: 67 U/L (ref 39–117)
ALT: 72 U/L — ABNORMAL HIGH (ref 0–53)
AST: 40 U/L — ABNORMAL HIGH (ref 0–37)
Albumin: 4.6 g/dL (ref 3.5–5.2)
BILIRUBIN DIRECT: 0.3 mg/dL (ref 0.0–0.3)
BILIRUBIN TOTAL: 1.9 mg/dL — AB (ref 0.2–1.2)
Total Protein: 6.8 g/dL (ref 6.0–8.3)

## 2016-03-25 LAB — LIPID PANEL
CHOLESTEROL: 197 mg/dL (ref 0–200)
HDL: 55.3 mg/dL (ref >39.00)
LDL CALC: 126 mg/dL — AB (ref 0–99)
NonHDL: 141.64
TRIGLYCERIDES: 79 mg/dL (ref 0.0–149.0)
Total CHOL/HDL Ratio: 4
VLDL: 15.8 mg/dL (ref 0.0–40.0)

## 2016-03-25 LAB — TSH: TSH: 1.23 u[IU]/mL (ref 0.35–4.50)

## 2016-03-25 MED ORDER — SERTRALINE HCL 100 MG PO TABS: 100.0000 mg | ORAL_TABLET | Freq: Every day | ORAL | 3 refills | Status: DC

## 2016-03-25 MED ORDER — PRAMIPEXOLE DIHYDROCHLORIDE 0.25 MG PO TABS: 0.2500 mg | ORAL_TABLET | Freq: Every day | ORAL | 2 refills | Status: DC

## 2016-03-25 NOTE — Progress Notes (Signed)
   Subjective:    Patient ID: Howard Jacobs, male    DOB: [birth year], 41 y.o.   MRN: [id number]  HPI 41 yr old male for a well exam. He has one issue to discuss today. For the past 5 years he has had intemittent restlessness of the legs and it seems to be getting worse. He describes a desire to move his legs or to contract and relax his legs when lying in bed or when sitting for prolonged periods. There is no pain or discomfort. He also asks about testing for hemochromatosis. His mother and two of her siblings all had this condition. He is recovering from a Bells palsy that affected the right side of his face. He was seen in the ER on 02-18-16 and had a negative brain MRI. He was treated with Valtrex and prednisone. He now has only slight weakness of the face.    Review of Systems  Constitutional: Negative.   HENT: Negative.   Eyes: Negative.   Respiratory: Negative.   Cardiovascular: Negative.   Gastrointestinal: Negative.   Genitourinary: Negative.   Musculoskeletal: Negative.   Skin: Negative.   Neurological: Negative.   Psychiatric/Behavioral: Negative.        Objective:   Physical Exam  Constitutional: He is oriented to person, place, and time. He appears well-developed and well-nourished. No distress.  HENT:  Head: Normocephalic and atraumatic.  Right Ear: External ear normal.  Left Ear: External ear normal.  Nose: Nose normal.  Mouth/Throat: Oropharynx is clear and moist. No oropharyngeal exudate.  Eyes: Conjunctivae and EOM are normal. Pupils are equal, round, and reactive to light. Right eye exhibits no discharge. Left eye exhibits no discharge. No scleral icterus.  Neck: Neck supple. No JVD present. No tracheal deviation present. No thyromegaly present.  Cardiovascular: Normal rate, regular rhythm, normal heart sounds and intact distal pulses.  Exam reveals no gallop and no friction rub.   No murmur heard. Pulmonary/Chest: Effort normal and breath sounds normal. No  respiratory distress. He has no wheezes. He has no rales. He exhibits no tenderness.  Abdominal: Soft. Bowel sounds are normal. He exhibits no distension and no mass. There is no tenderness. There is no rebound and no guarding.  Genitourinary: Rectum normal, prostate normal and penis normal. Rectal exam shows guaiac negative stool. No penile tenderness.  Musculoskeletal: Normal range of motion. He exhibits no edema or tenderness.  Lymphadenopathy:    He has no cervical adenopathy.  Neurological: He is alert and oriented to person, place, and time. He has normal reflexes. No cranial nerve deficit. He exhibits normal muscle tone. Coordination normal.  Skin: Skin is warm and dry. No rash noted. He is not diaphoretic. No erythema. No pallor.  Psychiatric: He has a normal mood and affect. His behavior is normal. Judgment and thought content normal.          Assessment & Plan:  Well exam. Get fasting labs. His anxiety is stable. He has restless legs and he will try Mirapex 0.25 mg at bedtime. Refer to Hematology to evaluate for hemachromatosis.  Alysia Penna, MD

## 2016-03-25 NOTE — Progress Notes (Signed)
Pre visit review using our clinic review tool, if applicable. No additional management support is needed unless otherwise documented below in the visit note. 

## 2016-04-14 ENCOUNTER — Ambulatory Visit (INDEPENDENT_AMBULATORY_CARE_PROVIDER_SITE_OTHER): Payer: BLUE CROSS/BLUE SHIELD | Admitting: Family Medicine

## 2016-04-14 ENCOUNTER — Encounter: Payer: Self-pay | Admitting: Family Medicine

## 2016-04-14 VITALS — BP 138/96 | HR 80 | Temp 98.5°F | Ht 72.0 in | Wt 242.0 lb

## 2016-04-14 DIAGNOSIS — B349 Viral infection, unspecified: Secondary | ICD-10-CM | POA: Diagnosis not present

## 2016-04-14 DIAGNOSIS — R509 Fever, unspecified: Secondary | ICD-10-CM

## 2016-04-14 LAB — POC INFLUENZA A&B (BINAX/QUICKVUE)
INFLUENZA A, POC: NEGATIVE
Influenza B, POC: NEGATIVE

## 2016-04-14 NOTE — Progress Notes (Signed)
Pre visit review using our clinic review tool, if applicable. No additional management support is needed unless otherwise documented below in the visit note. 

## 2016-04-14 NOTE — Patient Instructions (Signed)
WE NOW OFFER   [facility] FAST TRACK!!!  SAME DAY Appointments for ACUTE CARE  Such as: Sprains, Injuries, cuts, abrasions, rashes, muscle pain, joint pain, back pain Colds, flu, sore throats, headache, allergies, cough, fever  Ear pain, sinus and eye infections Abdominal pain, nausea, vomiting, diarrhea, upset stomach Animal/insect bites  3 Easy Ways to Schedule: Walk-In Scheduling Call in scheduling Mychart Sign-up: [email or web address]         

## 2016-04-14 NOTE — Progress Notes (Signed)
   Subjective:    Patient ID: Howard Jacobs, male    DOB: [birth year], 41 y.o.   MRN: [id number]  HPI Here for 3 days of fevers to 101.5 degress, headache, body aches, and a dry cough. No ST or NVD. Drinking fluids and taking Ibuprofen.    Review of Systems  Constitutional: Positive for chills and fever. Negative for diaphoresis.  HENT: Negative.   Eyes: Negative.   Respiratory: Positive for cough.   Gastrointestinal: Negative.        Objective:   Physical Exam  Constitutional: He appears well-developed and well-nourished. No distress.  HENT:  Right Ear: External ear normal.  Left Ear: External ear normal.  Nose: Nose normal.  Mouth/Throat: Oropharynx is clear and moist.  Eyes: Conjunctivae are normal.  Neck: No thyromegaly present.  Cardiovascular: Normal rate, regular rhythm, normal heart sounds and intact distal pulses.   Pulmonary/Chest: Effort normal and breath sounds normal.  Lymphadenopathy:    He has no cervical adenopathy.          Assessment & Plan:  Viral illness. Use Advil prn. This should resolve in the next few days.  Alysia Penna, MD

## 2016-04-22 ENCOUNTER — Encounter: Payer: Self-pay | Admitting: Family Medicine

## 2016-04-22 DIAGNOSIS — Z8349 Family history of other endocrine, nutritional and metabolic diseases: Secondary | ICD-10-CM

## 2016-04-26 NOTE — Telephone Encounter (Signed)
Increase the Mirapex to 0.75 mg to take qhs, call in #30 with 2 rf

## 2016-04-26 NOTE — Telephone Encounter (Signed)
Referral was done  

## 2016-04-27 ENCOUNTER — Other Ambulatory Visit: Payer: Self-pay | Admitting: Family Medicine

## 2016-04-27 MED ORDER — PRAMIPEXOLE DIHYDROCHLORIDE 0.75 MG PO TABS: 0.7500 mg | ORAL_TABLET | Freq: Every day | ORAL | 0 refills | Status: DC

## 2016-04-27 NOTE — Telephone Encounter (Signed)
I tried to reach pt and no answer.  

## 2016-04-28 ENCOUNTER — Encounter: Payer: Self-pay | Admitting: Family Medicine

## 2016-04-28 ENCOUNTER — Ambulatory Visit (INDEPENDENT_AMBULATORY_CARE_PROVIDER_SITE_OTHER): Payer: BLUE CROSS/BLUE SHIELD | Admitting: Family Medicine

## 2016-04-28 VITALS — BP 135/95 | HR 83 | Temp 98.5°F | Ht 72.0 in | Wt 239.0 lb

## 2016-04-28 DIAGNOSIS — J209 Acute bronchitis, unspecified: Secondary | ICD-10-CM | POA: Diagnosis not present

## 2016-04-28 MED ORDER — AZITHROMYCIN 250 MG PO TABS: ORAL_TABLET | ORAL | 0 refills | Status: DC

## 2016-04-28 NOTE — Progress Notes (Signed)
Pre visit review using our clinic review tool, if applicable. No additional management support is needed unless otherwise documented below in the visit note. 

## 2016-04-28 NOTE — Progress Notes (Signed)
   Subjective:    Patient ID: Howard Jacobs, male    DOB: [birth year], 41 y.o.   MRN: [id number]  HPI Here for a cough that produces yellow sputum. He was seen here on 04-14-16 with viral symptoms but these have evolved. The fever and body aches are gone. Now the cough is his major concern.    Review of Systems  Constitutional: Negative.   HENT: Negative.   Eyes: Negative.   Respiratory: Positive for cough.        Objective:   Physical Exam  Constitutional: He appears well-developed and well-nourished.  HENT:  Right Ear: External ear normal.  Left Ear: External ear normal.  Nose: Nose normal.  Mouth/Throat: Oropharynx is clear and moist.  Eyes: Conjunctivae are normal.  Neck: No thyromegaly present.  Pulmonary/Chest: Effort normal. No respiratory distress. He has no wheezes. He has no rales.  Scattered rhonchi   Lymphadenopathy:    He has no cervical adenopathy.          Assessment & Plan:  Bronchitis, treat with a Zpack. Alysia Penna, MD

## 2016-04-28 NOTE — Patient Instructions (Signed)
WE NOW OFFER   [facility] FAST TRACK!!!  SAME DAY Appointments for ACUTE CARE  Such as: Sprains, Injuries, cuts, abrasions, rashes, muscle pain, joint pain, back pain Colds, flu, sore throats, headache, allergies, cough, fever  Ear pain, sinus and eye infections Abdominal pain, nausea, vomiting, diarrhea, upset stomach Animal/insect bites  3 Easy Ways to Schedule: Walk-In Scheduling Call in scheduling Mychart Sign-up: [email or web address]         

## 2016-05-02 ENCOUNTER — Encounter: Payer: Self-pay | Admitting: Family Medicine

## 2016-05-03 ENCOUNTER — Telehealth: Payer: Self-pay | Admitting: Family Medicine

## 2016-05-03 MED ORDER — AMOXICILLIN-POT CLAVULANATE 875-125 MG PO TABS: 1.0000 | ORAL_TABLET | Freq: Two times a day (BID) | ORAL | 0 refills | Status: DC

## 2016-05-03 NOTE — Telephone Encounter (Signed)
Pt is out of town at [employer] and is having a coughing attack and sometime loses his breath and would like to see if Dr. Sarajane Jews would call something in for him.  [facility] [address] [place] [address]

## 2016-05-03 NOTE — Telephone Encounter (Signed)
Call in Augmentin 875 bid for 10 days  

## 2016-05-03 NOTE — Telephone Encounter (Signed)
Called in Augmentin 875 BID x 10 days to [facility] in [place]

## 2016-05-03 NOTE — Telephone Encounter (Signed)
Just spoke with pt and advised of rx. Also advised him to take Delsym for dry cough. He will call or email if not improving. Nothing further needed at this time. RX sent.

## 2016-05-05 ENCOUNTER — Other Ambulatory Visit: Payer: Self-pay | Admitting: Family Medicine

## 2016-05-05 MED ORDER — METHYLPREDNISOLONE 4 MG PO TBPK: ORAL_TABLET | ORAL | 0 refills | Status: DC

## 2016-05-05 NOTE — Telephone Encounter (Signed)
Call in a Medrol dose pack  

## 2016-07-30 ENCOUNTER — Other Ambulatory Visit: Payer: Self-pay | Admitting: Family Medicine

## 2016-10-17 ENCOUNTER — Other Ambulatory Visit: Payer: Self-pay | Admitting: Family Medicine

## 2016-10-28 ENCOUNTER — Encounter: Payer: Self-pay | Admitting: Family Medicine

## 2016-12-17 ENCOUNTER — Ambulatory Visit (INDEPENDENT_AMBULATORY_CARE_PROVIDER_SITE_OTHER): Payer: BLUE CROSS/BLUE SHIELD

## 2016-12-17 DIAGNOSIS — Z23 Encounter for immunization: Secondary | ICD-10-CM | POA: Diagnosis not present

## 2016-12-27 ENCOUNTER — Ambulatory Visit: Payer: BLUE CROSS/BLUE SHIELD | Admitting: Family Medicine

## 2016-12-27 ENCOUNTER — Encounter: Payer: Self-pay | Admitting: Sports Medicine

## 2016-12-27 ENCOUNTER — Ambulatory Visit: Payer: Self-pay

## 2016-12-27 ENCOUNTER — Ambulatory Visit: Payer: BLUE CROSS/BLUE SHIELD | Admitting: Sports Medicine

## 2016-12-27 VITALS — BP 122/84 | HR 76 | Ht 72.0 in | Wt 243.0 lb

## 2016-12-27 DIAGNOSIS — M25512 Pain in left shoulder: Secondary | ICD-10-CM | POA: Diagnosis not present

## 2016-12-27 NOTE — Procedures (Signed)
PROCEDURE NOTE -  ULTRASOUND GUIDEDINJECTION: LEFT Intra-articular Shoulder Images were obtained and interpreted by myself, Teresa Coombs, DO  Images have been saved and stored to PACS system. Images obtained on: GE S7 Ultrasound machine  ULTRASOUND FINDINGS:  Biceps Tendon: Intact but a small amount of hypoechoic change at the superior portion with a small halo sign. Pec Major Insertion: Normal Subscapularis Tendon: Normal Supraspinatus Tendon: Thickening with hypoechoic change and interstitial tear without significant retraction. Infraspinatus/Teres Minor Tendon: Normal AC Joint: Normal JOINT: No significant GH spurring appreciated LABRUM: Thickening of the labrum but no overt tearing   DESCRIPTION OF PROCEDURE:  The patient's clinical condition is marked by substantial pain and/or significant functional disability. Other conservative therapy has not provided relief, is contraindicated, or not appropriate. There is a reasonable likelihood that injection will significantly improve the patient's pain and/or functional impairment. After discussing the risks, benefits and expected outcomes of the injection and all questions were reviewed and answered, the patient wished to undergo the above named procedure. Verbal consent was obtained. The ultrasound was used to identify the target structure and adjacent neurovascular structures. The skin was then prepped in sterile fashion and the target structure was injected under direct visualization using sterile technique as below: PREP: Alcohol, Ethel Chloride APPROACH: Posterior, sterile stopcock Technique, 21 g 2 " needle INJECTATE: 2cc 0.5% marcaine, 2cc 40mg  DepoMedrol ASPIRATE: N/A DRESSING: Band-Aid  Post procedural instructions including recommending icing and warning signs for infection were reviewed. This procedure was well tolerated and there were no complications.   IMPRESSION: Succesful US Guided Injection

## 2016-12-27 NOTE — Patient Instructions (Signed)

## 2016-12-27 NOTE — Procedures (Signed)
PROCEDURE NOTE: THERAPEUTIC EXERCISES (97110) 15 minutes spent for Therapeutic exercises as below and as referenced in the AVS. This included exercises focusing on stretching, strengthening, with significant focus on eccentric aspects.  Proper technique shown and discussed handout in great detail with ATC. All questions were discussed and answered.   Long term goals include an improvement in range of motion, strength, endurance as well as avoiding reinjury. Frequency of visits is one time as determined during today's  office visit. Frequency of exercises to be performed is as per handout.  EXERCISES REVIEWED:  To begin in 2 weeks  Scapular stabilization  Intrinsic rotator cuff  Eccentric biceps

## 2016-12-27 NOTE — Assessment & Plan Note (Signed)
Multiple issues seen on ultrasound today including a small amount of subacromial bursitis, thickened Tendinopathic changes of the supraspinatus and biceps tendon.  Suspect a possible SLAP lesion.  Intra-articular injection performed today for both diagnostic and therapeutic purposes.  Begin therapeutic exercises in 2 weeks, follow-up in 6 weeks for advancing exercises and to ensure clinical resolution

## 2016-12-27 NOTE — Progress Notes (Signed)
OFFICE VISIT NOTE Howard Jacobs. Howard Jacobs, Howard Jacobs at [place]  Howard Jacobs - 41 y.o. male MRN [id number]  Date of birth: [birth year]  Visit Date: 12/27/2016  PCP: Howard Morale, MD   Referred by: Howard Morale, MD  Howard Jacobs, CMA acting as scribe for Dr. Paulla Fore.  SUBJECTIVE:   Chief Complaint  Patient presents with  . New Patient (Initial Visit)   HPI: As below and per problem based documentation when appropriate.  Evaluation of LT shoulder pain Pain has been present x 3-6 mos.  No known injury or trauma.   The pain is described as constant dull ache with occasional stabbing pain. Pain is rated as 7/10 when lifting heavy objects. He has noticed some weakness in the LT shoulder. He has slightly limited ROM in the LT shoulder which gets worse when reaching up and back. He has noticed some clicking and popping in the shoulder.   Worsened with driving; pain on the anterior aspect of the shoulder when turning the stirring wheel. Pain is worse when lifting heavy objects.  Improves with rest Therapies tried include : He has taken Ibuprofen x 1 month with little relief. He has also tried Duexis with short term relief.   Other associated symptoms include: No radiation of pain into the neck, LT arm, or fingers.   No recent xray of the shoulder    Review of Systems  Constitutional: Negative for chills, fever and malaise/fatigue.  Respiratory: Negative for shortness of breath and wheezing.   Cardiovascular: Negative for chest pain, palpitations and leg swelling.  Musculoskeletal: Positive for joint pain.  Neurological: Positive for weakness. Negative for dizziness, tingling and headaches.    Otherwise per HPI.  HISTORY & PERTINENT PRIOR DATA:  No specialty comments available. He reports that  has never smoked. He has quit using smokeless tobacco. No results for input(s): HGBA1C, LABURIC, CREATINE in the last 8760  hours.  Invalid input(s): CR Patient Active Problem List   Diagnosis Date Noted  . Left shoulder pain 12/27/2016  . Bell's palsy 03/25/2016  . Restless legs syndrome (RLS) 03/25/2016  . Family history of hemochromatosis 03/25/2016  . Strain of calf muscle 07/11/2015  . HYPERLIPIDEMIA 03/09/2010  . ANXIETY 02/11/2010  . ALLERGIC RHINITIS 02/11/2010   Past Medical History:  Diagnosis Date  . Allergic rhinitis   . Anxiety   . Depression   . History of chicken pox   . Hyperlipidemia    Family History  Problem Relation Age of Onset  . Alcohol abuse Mother   . Arthritis Mother   . Alcohol abuse Father   . Arthritis Father   . Colon cancer Unknown        grandparents  . Breast cancer Unknown   . Lung cancer Unknown        grandparents  . Heart disease Unknown        grandparents  . Depression Unknown        parents and grandparents   Past Surgical History:  Procedure Laterality Date  . NO PAST SURGERIES     Social History   Occupational History  . Not on file  Tobacco Use  . Smoking status: Never Smoker  . Smokeless tobacco: Former Network engineer and Sexual Activity  . Alcohol use: Yes    Alcohol/week: 0.0 oz    Comment: once a month  . Drug use: No  . Sexual activity: Not on file  No Known Allergies Outpatient Medications Prior to Visit  Medication Sig Dispense Refill  . pramipexole (MIRAPEX) 0.75 MG tablet TAKE 1 TABLET AT BEDTIME 90 tablet 1  . sertraline (ZOLOFT) 100 MG tablet Take 1 tablet (100 mg total) by mouth daily. 90 tablet 3  . amoxicillin-clavulanate (AUGMENTIN) 875-125 MG tablet Take 1 tablet by mouth 2 (two) times daily. 20 tablet 0  . azithromycin (ZITHROMAX) 250 MG tablet As directed 6 tablet 0  . methylPREDNISolone (MEDROL DOSEPAK) 4 MG TBPK tablet Take as directed 21 tablet 0  . pramipexole (MIRAPEX) 0.75 MG tablet TAKE 1 TABLET AT BEDTIME 90 tablet 0   No facility-administered medications prior to visit.      OBJECTIVE:  VS:  HT:6'  (182.9 cm)   WT:243 lb (110.2 kg)  BMI:32.95    BP:122/84  HR:76bpm  TEMP: ( )  RESP:96 % EXAM: Findings:  WDWN, NAD, Non-toxic appearing Alert & appropriately interactive Not depressed or anxious appearing No increased work of breathing. Pupils are equal. EOM intact without nystagmus No clubbing or cyanosis of the extremities appreciated No significant rashes/lesions/ulcerations overlying the examined area. Radial pulses 2+/4.  No significant generalized UE edema. Sensation intact to light touch in upper extremities.  Left Shoulder Exam: Normal alignment, Normal Contours No overlying erythema/ecchymosis. No significant pain but a small amount of crepitation with axial load and circumduction localizing to the [facility] joint TTP over: Lateral deltoid No TTP over: AC joint or deltopectoral groove Internal Rotation: Abnormal-small amount of discomfort but 5 out of 5 strength External Rotation: Normal Empty can: Small amount of pain but 5 out of 5 strength Hawkins: Normal Neers: Normal Speeds:Abnormal-small amount of pain, preserved strength and symmetric O'Brien's: Abnormal-small to moderate amount of pain, preserved strength     RADIOLOGY: MSK US performed today ASSESSMENT & PLAN:     ICD-10-CM   1. Left shoulder pain, unspecified chronicity M25.512 [place] LIMITED JOINT SPACE STRUCTURES UP LEFT(NO LINKED CHARGES)    Misc procedure   ================================================================= Left shoulder pain Multiple issues seen on ultrasound today including a small amount of subacromial bursitis, thickened Tendinopathic changes of the supraspinatus and biceps tendon.  Suspect a possible SLAP lesion.  Intra-articular injection performed today for both diagnostic and therapeutic purposes.  Begin therapeutic exercises in 2 weeks, follow-up in 6 weeks for advancing exercises and to ensure clinical resolution  PROCEDURE NOTE -  ULTRASOUND GUIDEDINJECTION: LEFT Intra-articular  Shoulder Images were obtained and interpreted by myself, Teresa Coombs, DO  Images have been saved and stored to PACS system. Images obtained on: GE S7 Ultrasound machine  ULTRASOUND FINDINGS:  Biceps Tendon: Intact but a small amount of hypoechoic change at the superior portion with a small halo sign. Pec Major Insertion: Normal Subscapularis Tendon: Normal Supraspinatus Tendon: Thickening with hypoechoic change and interstitial tear without significant retraction. Infraspinatus/Teres Minor Tendon: Normal AC Joint: Normal JOINT: No significant GH spurring appreciated LABRUM: Thickening of the labrum but no overt tearing   DESCRIPTION OF PROCEDURE:  The patient's clinical condition is marked by substantial pain and/or significant functional disability. Other conservative therapy has not provided relief, is contraindicated, or not appropriate. There is a reasonable likelihood that injection will significantly improve the patient's pain and/or functional impairment. After discussing the risks, benefits and expected outcomes of the injection and all questions were reviewed and answered, the patient wished to undergo the above named procedure. Verbal consent was obtained. The ultrasound was used to identify the target structure and adjacent neurovascular structures. The skin was  then prepped in sterile fashion and the target structure was injected under direct visualization using sterile technique as below: PREP: Alcohol, Ethel Chloride APPROACH: Posterior, sterile stopcock Technique, 21 g 2 " needle INJECTATE: 2cc 0.5% marcaine, 2cc 40mg  DepoMedrol ASPIRATE: N/A DRESSING: Band-Aid  Post procedural instructions including recommending icing and warning signs for infection were reviewed. This procedure was well tolerated and there were no complications.   IMPRESSION: Succesful US Guided Injection        PROCEDURE NOTE: THERAPEUTIC EXERCISES (97110) 15 minutes spent for Therapeutic  exercises as below and as referenced in the AVS. This included exercises focusing on stretching, strengthening, with significant focus on eccentric aspects.  Proper technique shown and discussed handout in great detail with ATC. All questions were discussed and answered.   Long term goals include an improvement in range of motion, strength, endurance as well as avoiding reinjury. Frequency of visits is one time as determined during today's  office visit. Frequency of exercises to be performed is as per handout.  EXERCISES REVIEWED:  To begin in 2 weeks  Scapular stabilization  Intrinsic rotator cuff  Eccentric biceps  ================================================================= Patient Instructions  You had an injection today.  Things to be aware of after injection are listed below: . You may experience no significant improvement or even a slight worsening in your symptoms during the first 24 to 48 hours.  After that we expect your symptoms to improve gradually over the next 2 weeks for the medicine to have its maximal effect.  You should continue to have improvement out to 6 weeks after your injection. . Dr. Paulla Fore recommends icing the site of the injection for 20 minutes  1-2 times the day of your injection . You may shower but no swimming, tub bath or Jacuzzi for 24 hours. . If your bandage falls off this does not need to be replaced.  It is appropriate to remove the bandage after 4 hours. . You may resume light activities as tolerated unless otherwise directed per Dr. Paulla Fore during your visit  POSSIBLE STEROID SIDE EFFECTS:  Side effects from injectable steroids tend to be less than when taken orally however you may experience some of the symptoms listed below.  If experienced these should only last for a short period of time. Change in menstrual flow  Edema (swelling)  Increased appetite Skin flushing (redness)  Skin rash/acne  Thrush (oral) Yeast vaginitis    Increased  sweating  Depression Increased blood glucose levels Cramping and leg/calf  Euphoria (feeling happy)  POSSIBLE PROCEDURE SIDE EFFECTS: The side effects of the injection are usually fairly minimal however if you may experience some of the following side effects that are usually self-limited and will is off on their own.  If you are concerned please feel free to call the office with questions:  Increased numbness or tingling  Nausea or vomiting  Swelling or bruising at the injection site   Please call our office if if you experience any of the following symptoms over the next week as these can be signs of infection:   Fever greater than 100.24F  Significant swelling at the injection site  Significant redness or drainage from the injection site  If after 2 weeks you are continuing to have worsening symptoms please call our office to discuss what the next appropriate actions should be including the potential for a return office visit or other diagnostic testing.     Please perform the exercise program that we have prepared for you  and gone over in detail on a daily basis.  In addition to the handout you were provided you can access your program through: [email or web address]   Your unique program code is: [id number]  ================================================================= No future appointments.  Follow-up: Return in about 6 weeks (around 02/07/2017).   CMA/ATC served as Education administrator during this visit. History, Physical, and Plan performed by medical provider. Documentation and orders reviewed and attested to.      Teresa Coombs, [facility] Physician

## 2017-01-03 ENCOUNTER — Ambulatory Visit: Payer: Self-pay | Admitting: Surgery

## 2017-01-03 NOTE — H&P (Signed)
Howard Jacobs 01/03/2017 2:59 PM Location: [facility] Patient #: [id number] DOB: 14-Feb-1975 Married / Language: Undefined / Race: Refused to Report/Unreported Male  History of Present Illness Howard Moores A. Ludie Hudon MD; 01/03/2017 3:31 PM) Patient words: Patient said at the request of Dr. Allyson Sabal for a mass on scalp. The mass and present for many years. It is slowly getting larger. Locations posterior scalp just to the right of midline. It is not draining. There is no skin discoloration redness or discomfort. He does have pain though when he lays on it. There's been no drainage.  The patient is a 41 year old male.   Past Surgical History Sharyn Lull R. Rolena Infante, CMA; 01/03/2017 2:59 PM) No pertinent past surgical history  Diagnostic Studies History Sharyn Lull R. Rolena Infante, CMA; 01/03/2017 2:59 PM) Colonoscopy never  Allergies Sharyn Lull R. Brooks, CMA; 01/03/2017 2:59 PM) No Known Drug Allergies 01/03/2017  Medication History Sharyn Lull R. Brooks, CMA; 01/03/2017 3:00 PM) Sertraline HCl (100MG  Tablet, Oral) Active. Pramipexole Dihydrochloride (0.75MG  Tablet, Oral) Active. Medications Reconciled  Social History Sharyn Lull R. Brooks, CMA; 01/03/2017 2:59 PM) Alcohol use Moderate alcohol use. Caffeine use Carbonated beverages, Tea. No drug use Tobacco use Never smoker.  Family History Sharyn Lull R. Rolena Infante, CMA; 01/03/2017 2:59 PM) Alcohol Abuse Father, Mother. Arthritis Father, Mother. Depression Father, Mother. Heart disease in male family member before age 78  Other Problems Sharyn Lull R. Rolena Infante, CMA; 01/03/2017 2:59 PM) Anxiety Disorder Depression     Review of Systems [facility] R. Brooks CMA; 01/03/2017 2:59 PM) General Not Present- Appetite Loss, Chills, Fatigue, Fever, Night Sweats, Weight Gain and Weight Loss. Skin Not Present- Change in Wart/Mole, Dryness, Hives, Jaundice, New Lesions, Non-Healing Wounds, Rash and Ulcer. HEENT Present- Seasonal  Allergies. Not Present- Earache, Hearing Loss, Hoarseness, Nose Bleed, Oral Ulcers, Ringing in the Ears, Sinus Pain, Sore Throat, Visual Disturbances, Wears glasses/contact lenses and Yellow Eyes. Respiratory Not Present- Bloody sputum, Chronic Cough, Difficulty Breathing, Snoring and Wheezing. Breast Not Present- Breast Mass, Breast Pain, Nipple Discharge and Skin Changes. Cardiovascular Not Present- Chest Pain, Difficulty Breathing Lying Down, Leg Cramps, Palpitations, Rapid Heart Rate, Shortness of Breath and Swelling of Extremities. Gastrointestinal Not Present- Abdominal Pain, Bloating, Bloody Stool, Change in Bowel Habits, Chronic diarrhea, Constipation, Difficulty Swallowing, Excessive gas, Gets full quickly at meals, Hemorrhoids, Indigestion, Nausea, Rectal Pain and Vomiting. Male Genitourinary Not Present- Blood in Urine, Change in Urinary Stream, Frequency, Impotence, Nocturia, Painful Urination, Urgency and Urine Leakage. Musculoskeletal Present- Joint Pain, Muscle Pain and Muscle Weakness. Not Present- Back Pain, Joint Stiffness and Swelling of Extremities. Neurological Not Present- Decreased Memory, Fainting, Headaches, Numbness, Seizures, Tingling, Tremor, Trouble walking and Weakness. Psychiatric Present- Anxiety. Not Present- Bipolar, Change in Sleep Pattern, Depression, Fearful and Frequent crying. Endocrine Not Present- Cold Intolerance, Excessive Hunger, Hair Changes, Heat Intolerance, Hot flashes and New Diabetes. Hematology Not Present- Blood Thinners, Easy Bruising, Excessive bleeding, Gland problems, HIV and Persistent Infections.  Vitals [employer] R. Brooks CMA; 01/03/2017 2:59 PM) 01/03/2017 2:59 PM Weight: 245.25 lb Height: 72in Body Surface Area: 2.32 m Body Mass Index: 33.26 kg/m  BP: 126/82 (Sitting, Left Arm, Standard)      Physical Exam (Arvada Seaborn A. Cong Hightower MD; 01/03/2017 3:37 PM)  General Mental Status-Alert. General Appearance-Consistent with  stated age. Hydration-Well hydrated. Voice-Normal.  Head and Neck Note: 5 cm mobile mass posterior scalp just right of the midline and mobile consistent with lipoma  Chest and Lung Exam Chest and lung exam reveals -quiet, even and easy respiratory effort with no use of  accessory muscles and on auscultation, normal breath sounds, no adventitious sounds and normal vocal resonance. Inspection Chest Wall - Normal. Back - normal.  Cardiovascular Cardiovascular examination reveals -normal heart sounds, regular rate and rhythm with no murmurs and normal pedal pulses bilaterally.  Neurologic Neurologic evaluation reveals -alert and oriented x 3 with no impairment of recent or remote memory. Mental Status-Normal.  Musculoskeletal Normal Exam - Left-Upper Extremity Strength Normal and Lower Extremity Strength Normal. Normal Exam - Right-Upper Extremity Strength Normal and Lower Extremity Strength Normal.    Assessment & Plan (Krysia Zahradnik A. 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 Risks, benefits and other options discussed with the patient. Patient's opted for excision. Risks of bleeding, infection, recurrence, injury to neighboring structures, seizures, DVT, cardiovascular risk, exacerbation of her underlying medical problems.  Current Plans The pathophysiology of skin & subcutaneous masses was discussed. Natural history risks without surgery were discussed. I recommended surgery to remove the mass. I explained the technique of removal with use of local anesthesia & possible need for more aggressive sedation/anesthesia for patient comfort.  Risks such as bleeding, infection, wound breakdown, heart attack, death, and other risks were discussed. I noted a good likelihood this will help address the problem. Possibility that this will not correct all symptoms was explained. Possibility of regrowth/recurrence of  the mass was discussed. We will work to minimize complications. Questions were answered. The patient expresses understanding & wishes to proceed with surgery.  Pt Education - CCS Free Text Education/Instructions: discussed with patient and provided information.

## 2017-01-03 NOTE — H&P (View-Only) (Signed)
Howard Jacobs 01/03/2017 2:59 PM Location: [facility] Patient #: [id number] DOB: 02/19/1975 Married / Language: Undefined / Race: Refused to Report/Unreported Male  History of Present Illness Howard Moores A. Deveion Denz MD; 01/03/2017 3:31 PM) Patient words: Patient said at the request of Dr. Allyson Sabal for a mass on scalp. The mass and present for many years. It is slowly getting larger. Locations posterior scalp just to the right of midline. It is not draining. There is no skin discoloration redness or discomfort. He does have pain though when he lays on it. There's been no drainage.  The patient is a 41 year old male.   Past Surgical History Sharyn Lull R. Rolena Infante, CMA; 01/03/2017 2:59 PM) No pertinent past surgical history  Diagnostic Studies History Sharyn Lull R. Rolena Infante, CMA; 01/03/2017 2:59 PM) Colonoscopy never  Allergies Sharyn Lull R. Brooks, CMA; 01/03/2017 2:59 PM) No Known Drug Allergies 01/03/2017  Medication History Sharyn Lull R. Brooks, CMA; 01/03/2017 3:00 PM) Sertraline HCl (100MG  Tablet, Oral) Active. Pramipexole Dihydrochloride (0.75MG  Tablet, Oral) Active. Medications Reconciled  Social History Sharyn Lull R. Brooks, CMA; 01/03/2017 2:59 PM) Alcohol use Moderate alcohol use. Caffeine use Carbonated beverages, Tea. No drug use Tobacco use Never smoker.  Family History Sharyn Lull R. Rolena Infante, CMA; 01/03/2017 2:59 PM) Alcohol Abuse Father, Mother. Arthritis Father, Mother. Depression Father, Mother. Heart disease in male family member before age 24  Other Problems Sharyn Lull R. Rolena Infante, CMA; 01/03/2017 2:59 PM) Anxiety Disorder Depression     Review of Systems [facility] R. Brooks CMA; 01/03/2017 2:59 PM) General Not Present- Appetite Loss, Chills, Fatigue, Fever, Night Sweats, Weight Gain and Weight Loss. Skin Not Present- Change in Wart/Mole, Dryness, Hives, Jaundice, New Lesions, Non-Healing Wounds, Rash and Ulcer. HEENT Present- Seasonal  Allergies. Not Present- Earache, Hearing Loss, Hoarseness, Nose Bleed, Oral Ulcers, Ringing in the Ears, Sinus Pain, Sore Throat, Visual Disturbances, Wears glasses/contact lenses and Yellow Eyes. Respiratory Not Present- Bloody sputum, Chronic Cough, Difficulty Breathing, Snoring and Wheezing. Breast Not Present- Breast Mass, Breast Pain, Nipple Discharge and Skin Changes. Cardiovascular Not Present- Chest Pain, Difficulty Breathing Lying Down, Leg Cramps, Palpitations, Rapid Heart Rate, Shortness of Breath and Swelling of Extremities. Gastrointestinal Not Present- Abdominal Pain, Bloating, Bloody Stool, Change in Bowel Habits, Chronic diarrhea, Constipation, Difficulty Swallowing, Excessive gas, Gets full quickly at meals, Hemorrhoids, Indigestion, Nausea, Rectal Pain and Vomiting. Male Genitourinary Not Present- Blood in Urine, Change in Urinary Stream, Frequency, Impotence, Nocturia, Painful Urination, Urgency and Urine Leakage. Musculoskeletal Present- Joint Pain, Muscle Pain and Muscle Weakness. Not Present- Back Pain, Joint Stiffness and Swelling of Extremities. Neurological Not Present- Decreased Memory, Fainting, Headaches, Numbness, Seizures, Tingling, Tremor, Trouble walking and Weakness. Psychiatric Present- Anxiety. Not Present- Bipolar, Change in Sleep Pattern, Depression, Fearful and Frequent crying. Endocrine Not Present- Cold Intolerance, Excessive Hunger, Hair Changes, Heat Intolerance, Hot flashes and New Diabetes. Hematology Not Present- Blood Thinners, Easy Bruising, Excessive bleeding, Gland problems, HIV and Persistent Infections.  Vitals [employer] R. Brooks CMA; 01/03/2017 2:59 PM) 01/03/2017 2:59 PM Weight: 245.25 lb Height: 72in Body Surface Area: 2.32 m Body Mass Index: 33.26 kg/m  BP: 126/82 (Sitting, Left Arm, Standard)      Physical Exam (Bellamy Rubey A. Gaylan Fauver MD; 01/03/2017 3:37 PM)  General Mental Status-Alert. General Appearance-Consistent with  stated age. Hydration-Well hydrated. Voice-Normal.  Head and Neck Note: 5 cm mobile mass posterior scalp just right of the midline and mobile consistent with lipoma  Chest and Lung Exam Chest and lung exam reveals -quiet, even and easy respiratory effort with no use of  accessory muscles and on auscultation, normal breath sounds, no adventitious sounds and normal vocal resonance. Inspection Chest Wall - Normal. Back - normal.  Cardiovascular Cardiovascular examination reveals -normal heart sounds, regular rate and rhythm with no murmurs and normal pedal pulses bilaterally.  Neurologic Neurologic evaluation reveals -alert and oriented x 3 with no impairment of recent or remote memory. Mental Status-Normal.  Musculoskeletal Normal Exam - Left-Upper Extremity Strength Normal and Lower Extremity Strength Normal. Normal Exam - Right-Upper Extremity Strength Normal and Lower Extremity Strength Normal.    Assessment & Plan (Valdemar Mcclenahan A. 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 Risks, benefits and other options discussed with the patient. Patient's opted for excision. Risks of bleeding, infection, recurrence, injury to neighboring structures, seizures, DVT, cardiovascular risk, exacerbation of her underlying medical problems.  Current Plans The pathophysiology of skin & subcutaneous masses was discussed. Natural history risks without surgery were discussed. I recommended surgery to remove the mass. I explained the technique of removal with use of local anesthesia & possible need for more aggressive sedation/anesthesia for patient comfort.  Risks such as bleeding, infection, wound breakdown, heart attack, death, and other risks were discussed. I noted a good likelihood this will help address the problem. Possibility that this will not correct all symptoms was explained. Possibility of regrowth/recurrence of  the mass was discussed. We will work to minimize complications. Questions were answered. The patient expresses understanding & wishes to proceed with surgery.  Pt Education - CCS Free Text Education/Instructions: discussed with patient and provided information.

## 2017-01-14 ENCOUNTER — Encounter: Payer: Self-pay | Admitting: Sports Medicine

## 2017-01-20 ENCOUNTER — Encounter (HOSPITAL_BASED_OUTPATIENT_CLINIC_OR_DEPARTMENT_OTHER): Payer: Self-pay | Admitting: *Deleted

## 2017-01-24 NOTE — Progress Notes (Signed)
Pt here to pick up Ensure drink, Instructed to finish by 1015 Wednesday morning, Pt states he will comply

## 2017-01-26 ENCOUNTER — Encounter (HOSPITAL_BASED_OUTPATIENT_CLINIC_OR_DEPARTMENT_OTHER): Payer: Self-pay | Admitting: Anesthesiology

## 2017-01-26 ENCOUNTER — Ambulatory Visit (HOSPITAL_BASED_OUTPATIENT_CLINIC_OR_DEPARTMENT_OTHER): Payer: BLUE CROSS/BLUE SHIELD | Admitting: Anesthesiology

## 2017-01-26 ENCOUNTER — Ambulatory Visit (HOSPITAL_BASED_OUTPATIENT_CLINIC_OR_DEPARTMENT_OTHER)
Admission: RE | Admit: 2017-01-26 | Discharge: 2017-01-26 | Disposition: A | Discharge status: Home / Self Care | Payer: BLUE CROSS/BLUE SHIELD | Source: Ambulatory Visit | Attending: Surgery | Admitting: Surgery

## 2017-01-26 ENCOUNTER — Encounter (HOSPITAL_BASED_OUTPATIENT_CLINIC_OR_DEPARTMENT_OTHER)
Admission: RE | Disposition: A | Discharge status: Home / Self Care | Payer: Self-pay | Source: Ambulatory Visit | Attending: Surgery

## 2017-01-26 ENCOUNTER — Other Ambulatory Visit: Payer: Self-pay

## 2017-01-26 DIAGNOSIS — F419 Anxiety disorder, unspecified: Secondary | ICD-10-CM | POA: Insufficient documentation

## 2017-01-26 DIAGNOSIS — D170 Benign lipomatous neoplasm of skin and subcutaneous tissue of head, face and neck: Secondary | ICD-10-CM | POA: Insufficient documentation

## 2017-01-26 DIAGNOSIS — Z79899 Other long term (current) drug therapy: Secondary | ICD-10-CM | POA: Diagnosis not present

## 2017-01-26 DIAGNOSIS — F329 Major depressive disorder, single episode, unspecified: Secondary | ICD-10-CM | POA: Diagnosis not present

## 2017-01-26 DIAGNOSIS — Z6833 Body mass index (BMI) 33.0-33.9, adult: Secondary | ICD-10-CM | POA: Insufficient documentation

## 2017-01-26 DIAGNOSIS — E669 Obesity, unspecified: Secondary | ICD-10-CM | POA: Insufficient documentation

## 2017-01-26 HISTORY — PX: LIPOMA EXCISION: SHX5283

## 2017-01-26 SURGERY — EXCISION LIPOMA
Anesthesia: General | Site: Scalp

## 2017-01-26 MED ORDER — PROPOFOL 500 MG/50ML IV EMUL
INTRAVENOUS | Status: AC
  Filled 2017-01-26: qty 50

## 2017-01-26 MED ORDER — FENTANYL CITRATE (PF) 100 MCG/2ML IJ SOLN
INTRAMUSCULAR | Status: AC
  Filled 2017-01-26: qty 2

## 2017-01-26 MED ORDER — PHENYLEPHRINE HCL 10 MG/ML IJ SOLN
INTRAMUSCULAR | Status: DC | PRN
  Administered 2017-01-26: 80 ug via INTRAVENOUS

## 2017-01-26 MED ORDER — EPHEDRINE SULFATE 50 MG/ML IJ SOLN
INTRAMUSCULAR | Status: DC | PRN
  Administered 2017-01-26 (×2): 25 mg via INTRAVENOUS

## 2017-01-26 MED ORDER — FENTANYL CITRATE (PF) 100 MCG/2ML IJ SOLN: 50.0000 ug | INTRAMUSCULAR | Status: DC | PRN

## 2017-01-26 MED ORDER — SCOPOLAMINE 1 MG/3DAYS TD PT72: 1.0000 | MEDICATED_PATCH | Freq: Once | TRANSDERMAL | Status: DC | PRN

## 2017-01-26 MED ORDER — ROCURONIUM BROMIDE 100 MG/10ML IV SOLN
INTRAVENOUS | Status: DC | PRN
  Administered 2017-01-26: 10 mg via INTRAVENOUS

## 2017-01-26 MED ORDER — ONDANSETRON HCL 4 MG/2ML IJ SOLN
INTRAMUSCULAR | Status: AC
  Filled 2017-01-26: qty 2

## 2017-01-26 MED ORDER — BUPIVACAINE-EPINEPHRINE 0.25% -1:200000 IJ SOLN
INTRAMUSCULAR | Status: DC | PRN
  Administered 2017-01-26: 10 mL

## 2017-01-26 MED ORDER — MIDAZOLAM HCL 2 MG/2ML IJ SOLN: 1.0000 mg | INTRAMUSCULAR | Status: DC | PRN

## 2017-01-26 MED ORDER — PROPOFOL 10 MG/ML IV BOLUS
INTRAVENOUS | Status: DC | PRN
  Administered 2017-01-26: 200 mg via INTRAVENOUS

## 2017-01-26 MED ORDER — LIDOCAINE HCL (CARDIAC) 20 MG/ML IV SOLN
INTRAVENOUS | Status: DC | PRN
  Administered 2017-01-26: 30 mg via INTRAVENOUS

## 2017-01-26 MED ORDER — SUCCINYLCHOLINE CHLORIDE 20 MG/ML IJ SOLN
INTRAMUSCULAR | Status: DC | PRN
  Administered 2017-01-26: 120 mg via INTRAVENOUS

## 2017-01-26 MED ORDER — LACTATED RINGERS IV SOLN
INTRAVENOUS | Status: DC
  Administered 2017-01-26 (×2): via INTRAVENOUS

## 2017-01-26 MED ORDER — LIDOCAINE 2% (20 MG/ML) 5 ML SYRINGE
INTRAMUSCULAR | Status: AC
  Filled 2017-01-26: qty 5

## 2017-01-26 MED ORDER — CEFAZOLIN SODIUM-DEXTROSE 2-4 GM/100ML-% IV SOLN
2.0000 g | INTRAVENOUS | Status: AC
  Administered 2017-01-26: 2 g via INTRAVENOUS

## 2017-01-26 MED ORDER — DEXAMETHASONE SODIUM PHOSPHATE 4 MG/ML IJ SOLN
INTRAMUSCULAR | Status: DC | PRN
  Administered 2017-01-26: 10 mg via INTRAVENOUS

## 2017-01-26 MED ORDER — PROPOFOL 10 MG/ML IV BOLUS
INTRAVENOUS | Status: AC
  Filled 2017-01-26: qty 20

## 2017-01-26 MED ORDER — FENTANYL CITRATE (PF) 100 MCG/2ML IJ SOLN
INTRAMUSCULAR | Status: DC | PRN
  Administered 2017-01-26: 100 ug via INTRAVENOUS

## 2017-01-26 MED ORDER — MIDAZOLAM HCL 2 MG/2ML IJ SOLN
INTRAMUSCULAR | Status: AC
  Filled 2017-01-26: qty 2

## 2017-01-26 MED ORDER — MIDAZOLAM HCL 5 MG/5ML IJ SOLN
INTRAMUSCULAR | Status: DC | PRN
  Administered 2017-01-26: 2 mg via INTRAVENOUS

## 2017-01-26 MED ORDER — CHLORHEXIDINE GLUCONATE CLOTH 2 % EX PADS: 6.0000 | MEDICATED_PAD | Freq: Once | CUTANEOUS | Status: DC

## 2017-01-26 MED ORDER — IBUPROFEN 800 MG PO TABS: 800.0000 mg | ORAL_TABLET | Freq: Three times a day (TID) | ORAL | 0 refills | Status: DC | PRN

## 2017-01-26 MED ORDER — SUCCINYLCHOLINE CHLORIDE 200 MG/10ML IV SOSY
PREFILLED_SYRINGE | INTRAVENOUS | Status: AC
  Filled 2017-01-26: qty 10

## 2017-01-26 MED ORDER — CELECOXIB 100 MG PO CAPS: 100.0000 mg | ORAL_CAPSULE | Freq: Two times a day (BID) | ORAL | 1 refills | Status: DC

## 2017-01-26 MED ORDER — DEXAMETHASONE SODIUM PHOSPHATE 10 MG/ML IJ SOLN
INTRAMUSCULAR | Status: AC
  Filled 2017-01-26: qty 1

## 2017-01-26 MED ORDER — CEFAZOLIN SODIUM 10 G IJ SOLR
3.0000 g | INTRAMUSCULAR | Status: DC
Start: 2017-01-27 — End: 2017-01-26

## 2017-01-26 SURGICAL SUPPLY — 40 items
BENZOIN TINCTURE PRP APPL 2/3 (GAUZE/BANDAGES/DRESSINGS) IMPLANT
BLADE SURG 10 STRL SS (BLADE) IMPLANT
BLADE SURG 15 STRL LF DISP TIS (BLADE) ×1 IMPLANT
BLADE SURG 15 STRL SS (BLADE) ×1
CANISTER SUCT 1200ML W/VALVE (MISCELLANEOUS) IMPLANT
CHLORAPREP W/TINT 26ML (MISCELLANEOUS) ×2 IMPLANT
COVER BACK TABLE 60X90IN (DRAPES) ×2 IMPLANT
COVER MAYO STAND STRL (DRAPES) ×2 IMPLANT
DECANTER SPIKE VIAL GLASS SM (MISCELLANEOUS) IMPLANT
DERMABOND ADVANCED (GAUZE/BANDAGES/DRESSINGS) ×2
DERMABOND ADVANCED .7 DNX12 (GAUZE/BANDAGES/DRESSINGS) ×2 IMPLANT
DRAPE LAPAROTOMY 100X72 PEDS (DRAPES) ×2 IMPLANT
DRAPE UTILITY XL STRL (DRAPES) ×2 IMPLANT
ELECT COATED BLADE 2.86 ST (ELECTRODE) ×2 IMPLANT
ELECT REM PT RETURN 9FT ADLT (ELECTROSURGICAL) ×2
ELECTRODE REM PT RTRN 9FT ADLT (ELECTROSURGICAL) ×1 IMPLANT
GLOVE BIOGEL PI IND STRL 7.0 (GLOVE) ×2 IMPLANT
GLOVE BIOGEL PI IND STRL 8 (GLOVE) ×1 IMPLANT
GLOVE BIOGEL PI INDICATOR 7.0 (GLOVE) ×2
GLOVE BIOGEL PI INDICATOR 8 (GLOVE) ×1
GLOVE ECLIPSE 6.5 STRL STRAW (GLOVE) ×2 IMPLANT
GLOVE ECLIPSE 8.0 STRL XLNG CF (GLOVE) ×2 IMPLANT
GOWN STRL REUS W/ TWL LRG LVL3 (GOWN DISPOSABLE) ×2 IMPLANT
GOWN STRL REUS W/TWL LRG LVL3 (GOWN DISPOSABLE) ×2
NEEDLE HYPO 25X1 1.5 SAFETY (NEEDLE) ×2 IMPLANT
NS IRRIG 1000ML POUR BTL (IV SOLUTION) IMPLANT
PACK BASIN DAY SURGERY FS (CUSTOM PROCEDURE TRAY) ×2 IMPLANT
PENCIL BUTTON HOLSTER BLD 10FT (ELECTRODE) ×2 IMPLANT
SLEEVE SCD COMPRESS KNEE MED (MISCELLANEOUS) ×2 IMPLANT
SPONGE LAP 4X18 X RAY DECT (DISPOSABLE) ×2 IMPLANT
STAPLER VISISTAT 35W (STAPLE) IMPLANT
STRIP CLOSURE SKIN 1/2X4 (GAUZE/BANDAGES/DRESSINGS) IMPLANT
SUT MON AB 4-0 PC3 18 (SUTURE) ×2 IMPLANT
SUT VICRYL 3-0 CR8 SH (SUTURE) ×2 IMPLANT
SUT VICRYL AB 3 0 TIES (SUTURE) IMPLANT
SYR CONTROL 10ML LL (SYRINGE) ×2 IMPLANT
TOWEL OR 17X24 6PK STRL BLUE (TOWEL DISPOSABLE) ×2 IMPLANT
TOWEL OR NON WOVEN STRL DISP B (DISPOSABLE) ×2 IMPLANT
TUBE CONNECTING 20X1/4 (TUBING) ×2 IMPLANT
YANKAUER SUCT BULB TIP NO VENT (SUCTIONS) ×2 IMPLANT

## 2017-01-26 NOTE — Anesthesia Procedure Notes (Signed)
Procedure Name: Intubation Date/Time: 01/26/2017 1:45 PM Performed by: Marrianne Mood, CRNA Pre-anesthesia Checklist: Patient identified, Emergency Drugs available, Suction available, Patient being monitored and Timeout performed Patient Re-evaluated:Patient Re-evaluated prior to induction Oxygen Delivery Method: Circle system utilized Preoxygenation: Pre-oxygenation with 100% oxygen Induction Type: IV induction Ventilation: Mask ventilation without difficulty Laryngoscope Size: Miller and 3 Grade View: Grade II Tube type: Oral Tube size: 8.0 mm Number of attempts: 1 Airway Equipment and Method: Stylet and Oral airway Placement Confirmation: ETT inserted through vocal cords under direct vision,  positive ETCO2 and breath sounds checked- equal and bilateral Secured at: 22 cm Tube secured with: Tape Dental Injury: Teeth and Oropharynx as per pre-operative assessment

## 2017-01-26 NOTE — Anesthesia Postprocedure Evaluation (Signed)
Anesthesia Post Note  Patient: Howard Jacobs  Procedure(s) Performed: EXCISION LIPOMA SCALP (N/A Scalp)     Patient location during evaluation: PACU Anesthesia Type: General Level of consciousness: awake and alert and oriented Pain management: pain level controlled Vital Signs Assessment: post-procedure vital signs reviewed and stable Respiratory status: spontaneous breathing, nonlabored ventilation and respiratory function stable Cardiovascular status: blood pressure returned to baseline and stable Postop Assessment: no apparent nausea or vomiting Anesthetic complications: no    Last Vitals:  Vitals:   01/26/17 1445 01/26/17 1500  BP: 117/70 115/79  Pulse: 83 82  Resp: 17 16  Temp:    SpO2: 96% 95%    Last Pain:  Vitals:   01/26/17 1445  TempSrc:   PainSc: 0-No pain                 Brailey Buescher A.

## 2017-01-26 NOTE — Anesthesia Preprocedure Evaluation (Addendum)
Anesthesia Evaluation  Patient identified by MRN, date of birth, ID band Patient awake    Reviewed: Allergy & Precautions, NPO status , Patient's Chart, lab work & pertinent test results  Airway Mallampati: I  TM Distance: >3 FB Neck ROM: Full    Dental no notable dental hx. (+) Caps, Teeth Intact   Pulmonary neg pulmonary ROS,    Pulmonary exam normal breath sounds clear to auscultation       Cardiovascular negative cardio ROS Normal cardiovascular exam Rhythm:Regular Rate:Normal     Neuro/Psych PSYCHIATRIC DISORDERS Anxiety Depression  Neuromuscular disease    GI/Hepatic negative GI ROS, Neg liver ROS,   Endo/Other  Hyperlipidemia Obesity  Renal/GU negative Renal ROS  negative genitourinary   Musculoskeletal Lipoma scalp   Abdominal (+) + obese,   Peds  Hematology negative hematology ROS (+)   Anesthesia Other Findings   Reproductive/Obstetrics                            Anesthesia Physical Anesthesia Plan  ASA: II  Anesthesia Plan: General   Post-op Pain Management:    Induction: Intravenous  PONV Risk Score and Plan: 4 or greater and Midazolam, Dexamethasone, Ondansetron and Treatment may vary due to age or medical condition  Airway Management Planned: LMA and Oral ETT  Additional Equipment:   Intra-op Plan:   Post-operative Plan: Extubation in OR  Informed Consent: I have reviewed the patients History and Physical, chart, labs and discussed the procedure including the risks, benefits and alternatives for the proposed anesthesia with the patient or authorized representative who has indicated his/her understanding and acceptance.   Dental advisory given  Plan Discussed with: CRNA, Anesthesiologist and Surgeon  Anesthesia Plan Comments:         Anesthesia Quick Evaluation

## 2017-01-26 NOTE — Op Note (Signed)
Preoperative diagnosis: Lipoma scalp 4 cm  Postoperative diagnosis: Same  Procedure: Excision of posterior scalp 4 cm subcutaneous lipoma  Surgeon: Erroll Luna MD  Anesthesia: General with local  EBL: 20 cc  Specimen: Lipoma to pathology  Drains: None  IV fluids: Per anesthesia record  Indications for procedure: The patient presents for excision of a lipoma from his right posterior scalp.  Risk, benefits and other options discussed with the patient.The procedure has been discussed with the patient.  Alternative therapies have been discussed with the patient.  Operative risks include bleeding,  Infection,  Organ injury,  Nerve injury,  Blood vessel injury,  DVT,  Pulmonary embolism,  Death,  And possible reoperation.  Medical management risks include worsening of present situation.  The success of the procedure is 50 -90 % at treating patients symptoms.  The patient understands and agrees to proceed.   Description of procedure: The patient was met in the holding area.  Questions were answered.  The mass to his right posterior scalp was marked.  He was taken to the operating room and placed supine.  After induction of general anesthesia was rolled up onto his left side and padded.  The area of his scalp was prepped and draped in sterile fashion after appropriate padding of the patient.  Timeout was done.  Transverse incision made over the lesion after infiltration with local anesthetic.  We dissected the subcutaneous tissues and a well-circumscribed lipoma was excised.  This was of the right posterior scalp.  The wound was hemostatic.  The wound was closed with 3-0 Vicryl and 4-0 Monocryl.  All final counts found to be correct.  Dermabond applied.  The patient was awoke extubated taken to recovery in satisfactory condition.

## 2017-01-26 NOTE — Discharge Instructions (Signed)
GENERAL SURGERY: POST OP INSTRUCTIONS ° °###################################################################### ° °EAT °Gradually transition to a high fiber diet with a fiber supplement over the next few weeks after discharge.  Start with a pureed / full liquid diet (see below) ° °WALK °Walk an hour a day.  Control your pain to do that.   ° °CONTROL PAIN °Control pain so that you can walk, sleep, tolerate sneezing/coughing, go up/down stairs. ° °HAVE A BOWEL MOVEMENT DAILY °Keep your bowels regular to avoid problems.  OK to try a laxative to override constipation.  OK to use an antidairrheal to slow down diarrhea.  Call if not better after 2 tries ° °CALL IF YOU HAVE PROBLEMS/CONCERNS °Call if you are still struggling despite following these instructions. °Call if you have concerns not answered by these instructions ° °###################################################################### ° ° ° °1. DIET: Follow a light bland diet the first 24 hours after arrival home, such as soup, liquids, crackers, etc.  Be sure to include lots of fluids daily.  Avoid fast food or heavy meals as your are more likely to get nauseated.   °2. Take your usually prescribed home medications unless otherwise directed. °3. PAIN CONTROL: °a. Pain is best controlled by a usual combination of three different methods TOGETHER: °i. Ice/Heat °ii. Over the counter pain medication °iii. Prescription pain medication °b. Most patients will experience some swelling and bruising around the incisions.  Ice packs or heating pads (30-60 minutes up to 6 times a day) will help. Use ice for the first few days to help decrease swelling and bruising, then switch to heat to help relax tight/sore spots and speed recovery.  Some people prefer to use ice alone, heat alone, alternating between ice & heat.  Experiment to what works for you.  Swelling and bruising can take several weeks to resolve.   °c. It is helpful to take an over-the-counter pain medication  regularly for the first few weeks.  Choose one of the following that works best for you: °i. Naproxen (Aleve, etc)  Two 220mg tabs twice a day °ii. Ibuprofen (Advil, etc) Three 200mg tabs four times a day (every meal & bedtime) °iii. Acetaminophen (Tylenol, etc) 500-650mg four times a day (every meal & bedtime) °d. A  prescription for pain medication (such as oxycodone, hydrocodone, etc) should be given to you upon discharge.  Take your pain medication as prescribed.  °i. If you are having problems/concerns with the prescription medicine (does not control pain, nausea, vomiting, rash, itching, etc), please call us [phone] to see if we need to switch you to a different pain medicine that will work better for you and/or control your side effect better. °ii. If you need a refill on your pain medication, please contact your pharmacy.  They will contact our office to request authorization. Prescriptions will not be filled after 5 pm or on week-ends. °4. Avoid getting constipated.  Between the surgery and the pain medications, it is common to experience some constipation.  Increasing fluid intake and taking a fiber supplement (such as Metamucil, Citrucel, FiberCon, MiraLax, etc) 1-2 times a day regularly will usually help prevent this problem from occurring.  A mild laxative (prune juice, Milk of Magnesia, MiraLax, etc) should be taken according to package directions if there are no bowel movements after 48 hours.   °5. Wash / shower every day.  You may shower over the dressings as they are waterproof.  Continue to shower over incision(s) after the dressing is off. °6. Remove your waterproof bandages   5 days after surgery.  You may leave the incision open to air.  You may have skin tapes (Steri Strips) covering the incision(s).  Leave them on until one week, then remove.  You may replace a dressing/Band-Aid to cover the incision for comfort if you wish.  ° ° ° ° °7. ACTIVITIES as tolerated:   °a. You may resume  regular (light) daily activities beginning the next day--such as daily self-care, walking, climbing stairs--gradually increasing activities as tolerated.  If you can walk 30 minutes without difficulty, it is safe to try more intense activity such as jogging, treadmill, bicycling, low-impact aerobics, swimming, etc. °b. Save the most intensive and strenuous activity for last such as sit-ups, heavy lifting, contact sports, etc  Refrain from any heavy lifting or straining until you are off narcotics for pain control.   °c. DO NOT PUSH THROUGH PAIN.  Let pain be your guide: If it hurts to do something, don't do it.  Pain is your body warning you to avoid that activity for another week until the pain goes down. °d. You may drive when you are no longer taking prescription pain medication, you can comfortably wear a seatbelt, and you can safely maneuver your car and apply brakes. °e. You may have sexual intercourse when it is comfortable.  °8. FOLLOW UP in our office °a. Please call [facility] at [phone] to set up an appointment to see your surgeon in the office for a follow-up appointment approximately 2-3 weeks after your surgery. °b. Make sure that you call for this appointment the day you arrive home to insure a convenient appointment time. °9. IF YOU HAVE DISABILITY OR FAMILY LEAVE FORMS, BRING THEM TO THE OFFICE FOR PROCESSING.  DO NOT GIVE THEM TO YOUR DOCTOR. ° ° °WHEN TO CALL US [phone]: °1. Poor pain control °2. Reactions / problems with new medications (rash/itching, nausea, etc)  °3. Fever over 101.5 F (38.5 C) °4. Worsening swelling or bruising °5. Continued bleeding from incision. °6. Increased pain, redness, or drainage from the incision °7. Difficulty breathing / swallowing ° ° The clinic staff is available to answer your questions during regular business hours (8:30am-5pm).  Please don’t hesitate to call and ask to speak to one of our nurses for clinical concerns.  ° If you have a medical emergency,  go to the nearest emergency room or call 911. ° A surgeon from [facility] is always on call at the hospitals ° ° °[facility] °[address], [address], [place], [place]  [address] ? °MAIN: [phone] ? TOLL FREE: [phone] ?  °FAX [phone] °[email or web address] ° ° °Post Anesthesia Home Care Instructions ° °Activity: °Get plenty of rest for the remainder of the day. A responsible individual must stay with you for 24 hours following the procedure.  °For the next 24 hours, DO NOT: °-Drive a car °-Operate machinery °-Drink alcoholic beverages °-Take any medication unless instructed by your physician °-Make any legal decisions or sign important papers. ° °Meals: °Start with liquid foods such as gelatin or soup. Progress to regular foods as tolerated. Avoid greasy, spicy, heavy foods. If nausea and/or vomiting occur, drink only clear liquids until the nausea and/or vomiting subsides. Call your physician if vomiting continues. ° °Special Instructions/Symptoms: °Your throat may feel dry or sore from the anesthesia or the breathing tube placed in your throat during surgery. If this causes discomfort, gargle with warm salt water. The discomfort should disappear within 24 hours. ° °  If you had a scopolamine patch placed behind your ear for the management of post- operative nausea and/or vomiting: ° °1. The medication in the patch is effective for 72 hours, after which it should be removed.  Wrap patch in a tissue and discard in the trash. Wash hands thoroughly with soap and water. °2. You may remove the patch earlier than 72 hours if you experience unpleasant side effects which may include dry mouth, dizziness or visual disturbances. °3. Avoid touching the patch. Wash your hands with soap and water after contact with the patch. °  ° ° °

## 2017-01-26 NOTE — Interval H&P Note (Signed)
History and Physical Interval Note:  01/26/2017 1:25 PM  Howard Jacobs  has presented today for surgery, with the diagnosis of lipoma  The various methods of treatment have been discussed with the patient and family. After consideration of risks, benefits and other options for treatment, the patient has consented to  Procedure(s): EXCISION LIPOMA SCALP (N/A) as a surgical intervention .  The patient's history has been reviewed, patient examined, no change in status, stable for surgery.  I have reviewed the patient's chart and labs.  Questions were answered to the patient's satisfaction.     [place]

## 2017-01-26 NOTE — Transfer of Care (Signed)
Immediate Anesthesia Transfer of Care Note  Patient: Howard Jacobs  Procedure(s) Performed: EXCISION LIPOMA SCALP (N/A Scalp)  Patient Location: PACU  Anesthesia Type:General  Level of Consciousness: awake and patient cooperative  Airway & Oxygen Therapy: Patient Spontanous Breathing and Patient connected to face mask oxygen  Post-op Assessment: Report given to RN and Post -op Vital signs reviewed and stable  Post vital signs: Reviewed and stable  Last Vitals:  Vitals:   01/26/17 1233  BP: (!) 139/93  Pulse: 75  Resp: 17  Temp: 37.1 C  SpO2: 99%    Last Pain:  Vitals:   01/26/17 1233  TempSrc: Oral  PainSc: 0-No pain         Complications: No apparent anesthesia complications

## 2017-01-27 ENCOUNTER — Encounter (HOSPITAL_BASED_OUTPATIENT_CLINIC_OR_DEPARTMENT_OTHER): Payer: Self-pay | Admitting: Surgery

## 2017-02-10 ENCOUNTER — Encounter: Payer: Self-pay | Admitting: Sports Medicine

## 2017-02-10 ENCOUNTER — Ambulatory Visit: Payer: BLUE CROSS/BLUE SHIELD | Admitting: Sports Medicine

## 2017-02-10 VITALS — BP 130/84 | HR 66 | Ht 72.0 in | Wt 251.2 lb

## 2017-02-10 DIAGNOSIS — G2589 Other specified extrapyramidal and movement disorders: Secondary | ICD-10-CM

## 2017-02-10 DIAGNOSIS — M25512 Pain in left shoulder: Secondary | ICD-10-CM

## 2017-02-10 MED ORDER — IBUPROFEN-FAMOTIDINE 800-26.6 MG PO TABS: 1.0000 | ORAL_TABLET | Freq: Three times a day (TID) | ORAL | 0 refills | Status: DC | PRN

## 2017-02-10 MED ORDER — IBUPROFEN-FAMOTIDINE 800-26.6 MG PO TABS
1.0000 | ORAL_TABLET | Freq: Three times a day (TID) | ORAL | 2 refills | Status: DC | PRN
Start: 2017-02-10 — End: 2017-05-18

## 2017-02-10 NOTE — Procedures (Signed)
PROCEDURE NOTE: THERAPEUTIC EXERCISES (97110) 15 minutes spent for Therapeutic exercises as below and as referenced in the AVS. This included exercises focusing on stretching, strengthening, with significant focus on eccentric aspects.  Proper technique shown and discussed handout in great detail with ATC. All questions were discussed and answered.   Long term goals include an improvement in range of motion, strength, endurance as well as avoiding reinjury. Frequency of visits is one time as determined during today's  office visit. Frequency of exercises to be performed is as per handout.  EXERCISES REVIEWED:  Scapular stabilization  Intrinsic rotator cuff strengthening

## 2017-02-10 NOTE — Progress Notes (Signed)
Howard Jacobs. [place], [place] [facility] [place]  Brahm Barbeau Youkhana - 42 y.o. male MRN [id number]  Date of birth: [birth year]  Visit Date: 02/10/2017  PCP: Laurey Morale, MD   Referred by: Laurey Morale, MD   Scribe for today's visit: Josepha Pigg, CMA     SUBJECTIVE:  Howard Jacobs is here for Follow-up (LT shoulder pain)  Compared to the last office visit, his previously described symptoms are improving, aching is not as severe, ROM has improved. Pain still present when lying down at night. He also has been having pain while playing with his sons.  Current symptoms are mild & are nonradiating He has tried taking Ibuprofen and Duexis with some relief. He had intra-articular injection and was provided with home exercises 12/27/2016. He admits that he hasn't been doing exercises as advised. He has also d/c Duexis and Ibuprofen. He has been taking Celebrex 1-2 x daily since his surgery 01/2017.    ROS Reports night time disturbances. Denies fevers, chills, or night sweats. Denies unexplained weight loss. Denies personal history of cancer. Denies changes in bowel or bladder habits. Denies recent unreported falls. Denies new or worsening dyspnea or wheezing. Denies headaches or dizziness.  Denies numbness, tingling or weakness  In the extremities.  Denies dizziness or presyncopal episodes Denies lower extremity edema     HISTORY & PERTINENT PRIOR DATA:  Prior History reviewed and updated per electronic medical record.  Significant history, findings, studies and interim changes include:  reports that  has never smoked. He has quit using smokeless tobacco. No results for input(s): HGBA1C, LABURIC, CREATINE in the last 8760 hours. No specialty comments available. No problems updated.   OBJECTIVE:  VS:  HT:6' (182.9 cm)   WT:251 lb 3.2 oz (113.9 kg)  BMI:34.06    BP:130/84  HR:66bpm  TEMP: ( )  RESP:95 %   PHYSICAL  EXAM: Constitutional: WDWN, Non-toxic appearing. Psychiatric: Alert & appropriately interactive. Not depressed or anxious appearing. Respiratory: No increased work of breathing. Trachea Midline Eyes: Pupils are equal. EOM intact without nystagmus. No scleral icterus Cardiovascular:  Peripheral Pulses: peripheral pulses symmetrical No clubbing or cyanosis appreciated Capillary Refill is normal, less than 2 seconds No signficant generalized edema/anasarca Sensory Exam: intact to light touch  Left Shoulder Exam: Normal alignment & Contours Skin: No overlying erythema/ecchymosis Neck: normal range of motion and supple Axial loading and circumduction produces: No pain or crepitation  Non tender to palpation over: Bony Landmarks TTP over: Deltopectoral groove Drop arm test: negative Hawkins: normal, no pain  Neers: normal, no pain   Internal Rotation:  ROM: Normal with no pain Strength: Normal External Rotation:  ROM: Normal with no pain Strength: Normal  Empty can: normal, no pain Strength: Normal Speeds: positive, mild pain Strength: Normal O'Briens: normal, no pain Strength: Normal  Scapular dyskinesis appreciated with shoulder arc  ASSESSMENT & PLAN:   1. Left shoulder pain, unspecified chronicity   2. Scapular dyskinesis    PLAN: Reports moderate improvement in symptoms overall but is continuing to have pain especially with overhead lifting and with laying directly on his side.  He is not been performing therapeutic exercises due to the holidays as well as from recovering from a lipoma excision.  He would like to try the exercises once again and they were reviewed in detail per procedure note.  Intermittent anti-inflammatories in the interim if any lack of improvement will need MRI arthrogram   ++++++++++++++++++++++++++++++++++++++++++++  Orders & Meds: Orders Placed This Encounter  Procedures  . Misc procedure    Meds ordered this encounter  Medications  .  Ibuprofen-Famotidine (DUEXIS) 800-26.6 MG TABS    Sig: Take 1 tablet by mouth 3 (three) times daily as needed. 1 tab po tid X 14 days then 1 tab po tid as needed    Dispense:  90 tablet    Refill:  2    Home Phone      [phone] Work Phone      [phone] Mobile          [phone]   . Ibuprofen-Famotidine (DUEXIS) 800-26.6 MG TABS    Sig: Take 1 tablet by mouth 3 (three) times daily as needed.    Dispense:  9 tablet    Refill:  0    ++++++++++++++++++++++++++++++++++++++++++++ Follow-up: Return in about 6 weeks (around 03/24/2017).   Pertinent documentation may be included in additional procedure notes, imaging studies, problem based documentation and patient instructions. Please see these sections of the encounter for additional information regarding this visit. CMA/ATC served as Education administrator during this visit. History, Physical, and Plan performed by medical provider. Documentation and orders reviewed and attested to.      Gerda Diss, [facility] Physician

## 2017-03-25 ENCOUNTER — Ambulatory Visit: Payer: BLUE CROSS/BLUE SHIELD | Admitting: Sports Medicine

## 2017-03-26 ENCOUNTER — Other Ambulatory Visit: Payer: Self-pay | Admitting: Family Medicine

## 2017-03-28 ENCOUNTER — Telehealth: Payer: Self-pay | Admitting: Family Medicine

## 2017-03-28 ENCOUNTER — Encounter: Payer: Self-pay | Admitting: Family Medicine

## 2017-03-28 NOTE — Telephone Encounter (Signed)
Copied from [place]. Topic: Inquiry >> Mar 28, 2017  1:25 PM Neva Seat wrote: Pt's sons have the flu.  Pt is requesting Tamiflu be sent in for him to try to avoid getting the flu.   Please call pt to let him know if this will be available for him.

## 2017-03-28 NOTE — Telephone Encounter (Signed)
Sir please see below message, please advise.

## 2017-03-28 NOTE — Telephone Encounter (Signed)
Copied from [place]. Topic: Inquiry >> Mar 28, 2017  1:25 PM Neva Seat wrote: Pt's sons have the flu.  Pt is requesting Tamiflu be sent in for him to try to avoid getting the flu.   Please call pt to let him know if this will be available for him. >> Mar 28, 2017  4:03 PM Neva Seat wrote: Pt's wife has called asking if the Tamiflu can be prescribed to pt and herself  - Today if possible.  Please call pt back.

## 2017-03-29 ENCOUNTER — Ambulatory Visit: Payer: BLUE CROSS/BLUE SHIELD | Admitting: Sports Medicine

## 2017-03-29 ENCOUNTER — Ambulatory Visit: Payer: Self-pay

## 2017-03-29 ENCOUNTER — Encounter: Payer: Self-pay | Admitting: Sports Medicine

## 2017-03-29 VITALS — BP 116/82 | HR 78 | Ht 72.0 in | Wt 238.0 lb

## 2017-03-29 DIAGNOSIS — G2589 Other specified extrapyramidal and movement disorders: Secondary | ICD-10-CM

## 2017-03-29 DIAGNOSIS — M25512 Pain in left shoulder: Secondary | ICD-10-CM

## 2017-03-29 MED ORDER — OSELTAMIVIR PHOSPHATE 75 MG PO CAPS
75.0000 mg | ORAL_CAPSULE | Freq: Two times a day (BID) | ORAL | 0 refills | Status: DC
Start: 2017-03-29 — End: 2017-04-20

## 2017-03-29 MED ORDER — SERTRALINE HCL 100 MG PO TABS: 50.0000 mg | ORAL_TABLET | Freq: Every day | ORAL | 3 refills | Status: AC

## 2017-03-29 NOTE — Patient Instructions (Signed)

## 2017-03-29 NOTE — Telephone Encounter (Signed)
Called and spoke to pt's wife Rx has been sent to requested pharmacy

## 2017-03-29 NOTE — Telephone Encounter (Signed)
Script requested was sent in to pharmacy by Dr. Sarajane Jews

## 2017-03-29 NOTE — Procedures (Signed)
PROCEDURE NOTE:  Ultrasound Guided: Injection: Left AC joint injection Images were obtained and interpreted by myself, Teresa Coombs, DO  Images have been saved and stored to PACS system. Images obtained on: GE S7 Ultrasound machine  ULTRASOUND FINDINGS:  + mushroom sign  DESCRIPTION OF PROCEDURE:  The patient's clinical condition is marked by substantial pain and/or significant functional disability. Other conservative therapy has not provided relief, is contraindicated, or not appropriate. There is a reasonable likelihood that injection will significantly improve the patient's pain and/or functional impairment.  After discussing the risks, benefits and expected outcomes of the injection and all questions were reviewed and answered, the patient wished to undergo the above named procedure. Verbal consent was obtained.  The ultrasound was used to identify the target structure and adjacent neurovascular structures. The skin was then prepped in sterile fashion and the target structure was injected under direct visualization using sterile technique as below:   PREP: Alcohol, Ethel Chloride,  APPROACH: direct, single injection, 25g 1.5in. INJECTATE: 1cc: 0.5% marcaine, 1cc: 80mg /mL DepoMedrol   ASPIRATE: None   DRESSING: Band-Aid  Post procedural instructions including recommending icing and warning signs for infection were reviewed.   This procedure was well tolerated and there were no complications.     IMPRESSION: Succesful Ultrasound Guided: Injection

## 2017-03-29 NOTE — Progress Notes (Signed)
Howard Jacobs. Howard Jacobs, [place] at [place]  Howard Jacobs - 42 y.o. male MRN [id number]  Date of birth: [birth year]  Visit Date: 03/29/2017  PCP: Laurey Morale, MD   Referred by: Laurey Morale, MD   Scribe for today's visit: Josepha Pigg, CMA     SUBJECTIVE:  Howard Jacobs is here for Follow-up (LT shoulder pain)  02/10/17:  Compared to the last office visit, his previously described symptoms are improving, aching is not as severe, ROM has improved. Pain still present when lying down at night. He also has been having pain while playing with his sons.  Current symptoms are mild & are nonradiating He has tried taking Ibuprofen and Duexis with some relief. He had intra-articular injection and was provided with home exercises 12/27/2016. He admits that he hasn't been doing exercises as advised. He has also d/c Duexis and Ibuprofen. He has been taking Celebrex 1-2 x daily since his surgery 01/2017.    03/29/17:  Compared to the last office visit, his previously described symptoms are improving but not back to normal. He still has pain when pushing up with the left arm, when turning the steering wheel hard, when pulling shirts over his head, and when reaching into his back pocket. He "messed up" his back about 3 weeks ago and stopped doing his exercises but prior to that he was doing them regularly. The pain is not constant anymore but will has pain with activity.  Current symptoms are moderate stabbing pain & are nonradiating He has been taking Duexis prn for the pain with some relief.   ROS Reports night time disturbances. Denies fevers, chills, or night sweats. Denies unexplained weight loss. Denies personal history of cancer. Denies changes in bowel or bladder habits. Denies recent unreported falls. Denies new or worsening dyspnea or wheezing. Denies headaches or dizziness.  Denies numbness, tingling or weakness  In the  extremities.  Denies dizziness or presyncopal episodes Denies lower extremity edema     HISTORY & PERTINENT PRIOR DATA:  Prior History reviewed and updated per electronic medical record.  Significant history, findings, studies and interim changes include:  reports that  has never smoked. He has quit using smokeless tobacco. No results for input(s): HGBA1C, LABURIC, CREATINE in the last 8760 hours. No specialty comments available. No problems updated.  OBJECTIVE:  VS:  HT:6' (182.9 cm)   WT:238 lb (108 kg)  BMI:32.27    BP:116/82  HR:78bpm  TEMP: ( )  RESP:96 %   PHYSICAL EXAM: Constitutional: WDWN, Non-toxic appearing. Psychiatric: Alert & appropriately interactive.  Not depressed or anxious appearing. Respiratory: No increased work of breathing.  Trachea Midline Eyes: Pupils are equal.  EOM intact without nystagmus.  No scleral icterus  NEUROVASCULAR exam: No clubbing or cyanosis appreciated No significant venous stasis changes Capillary Refill: normal, less than 2 seconds    Shoulder overall well aligned.  He has focal pain over the [facility] joint.  Some pain with biceps palpation.  Pain is worse with O'Brien's and speeds testing.  Rotator cuff strength intact.  No significant pain with axial load and circumduction. No additional findings.   ASSESSMENT & PLAN:   1. Left shoulder pain, unspecified chronicity   2. Scapular dyskinesis    PLAN: AC joint injection performed today.  If any lack of improvement will need further diagnostic evaluation with MR arthrogram.  Continue therapeutic exercises.  ++++++++++++++++++++++++++++++++++++++++++++ Orders & Meds: Orders Placed This Encounter  Procedures  . [place] MSK POCT ULTRASOUND    Meds ordered this encounter  Medications  . sertraline (ZOLOFT) 100 MG tablet    Sig: Take 0.5 tablets (50 mg total) by mouth daily.    Dispense:  90 tablet    Refill:  3    ++++++++++++++++++++++++++++++++++++++++++++ Follow-up: Return in  about 4 weeks (around 04/26/2017).   Pertinent documentation may be included in additional procedure notes, imaging studies, problem based documentation and patient instructions. Please see these sections of the encounter for additional information regarding this visit. CMA/ATC served as Education administrator during this visit. History, Physical, and Plan performed by medical provider. Documentation and orders reviewed and attested to.      Gerda Diss, [facility] Physician

## 2017-03-29 NOTE — Telephone Encounter (Signed)
Call in Tamiflu 75 mg bid for 5 days  

## 2017-03-30 ENCOUNTER — Encounter: Payer: Self-pay | Admitting: Sports Medicine

## 2017-04-20 ENCOUNTER — Encounter: Payer: Self-pay | Admitting: Family Medicine

## 2017-04-20 ENCOUNTER — Ambulatory Visit (INDEPENDENT_AMBULATORY_CARE_PROVIDER_SITE_OTHER): Payer: BLUE CROSS/BLUE SHIELD | Admitting: Family Medicine

## 2017-04-20 VITALS — BP 118/80 | HR 62 | Temp 98.0°F | Ht 71.0 in | Wt 231.6 lb

## 2017-04-20 DIAGNOSIS — Z0000 Encounter for general adult medical examination without abnormal findings: Secondary | ICD-10-CM

## 2017-04-20 LAB — HEPATIC FUNCTION PANEL
ALBUMIN: 4.5 g/dL (ref 3.5–5.2)
ALK PHOS: 67 U/L (ref 39–117)
ALT: 38 U/L (ref 0–53)
AST: 22 U/L (ref 0–37)
Bilirubin, Direct: 0.3 mg/dL (ref 0.0–0.3)
TOTAL PROTEIN: 6.9 g/dL (ref 6.0–8.3)
Total Bilirubin: 1.7 mg/dL — ABNORMAL HIGH (ref 0.2–1.2)

## 2017-04-20 LAB — BASIC METABOLIC PANEL
BUN: 20 mg/dL (ref 6–23)
CALCIUM: 9.6 mg/dL (ref 8.4–10.5)
CHLORIDE: 103 meq/L (ref 96–112)
CO2: 29 mEq/L (ref 19–32)
Creatinine, Ser: 0.87 mg/dL (ref 0.40–1.50)
GFR: 102.07 mL/min (ref >60.00)
GLUCOSE: 91 mg/dL (ref 70–99)
Potassium: 4.1 mEq/L (ref 3.5–5.1)
SODIUM: 139 meq/L (ref 135–145)

## 2017-04-20 LAB — CBC WITH DIFFERENTIAL/PLATELET
BASOS PCT: 0.9 % (ref 0.0–3.0)
Basophils Absolute: 0 10*3/uL (ref 0.0–0.1)
EOS ABS: 0.1 10*3/uL (ref 0.0–0.7)
Eosinophils Relative: 2.9 % (ref 0.0–5.0)
HCT: 42.5 % (ref 39.0–52.0)
Hemoglobin: 14.6 g/dL (ref 13.0–17.0)
Lymphocytes Relative: 25.9 % (ref 12.0–46.0)
Lymphs Abs: 1.3 10*3/uL (ref 0.7–4.0)
MCHC: 34.3 g/dL (ref 30.0–36.0)
MCV: 86.4 fl (ref 78.0–100.0)
MONO ABS: 0.4 10*3/uL (ref 0.1–1.0)
Monocytes Relative: 8.5 % (ref 3.0–12.0)
NEUTROS ABS: 3.2 10*3/uL (ref 1.4–7.7)
NEUTROS PCT: 61.8 % (ref 43.0–77.0)
Platelets: 152 10*3/uL (ref 150.0–400.0)
RBC: 4.91 Mil/uL (ref 4.22–5.81)
RDW: 13.6 % (ref 11.5–15.5)
WBC: 5.1 10*3/uL (ref 4.0–10.5)

## 2017-04-20 LAB — LIPID PANEL
CHOLESTEROL: 166 mg/dL (ref 0–200)
HDL: 48.3 mg/dL (ref >39.00)
LDL Cholesterol: 96 mg/dL (ref 0–99)
NonHDL: 117.47
Total CHOL/HDL Ratio: 3
Triglycerides: 106 mg/dL (ref 0.0–149.0)
VLDL: 21.2 mg/dL (ref 0.0–40.0)

## 2017-04-20 LAB — POC URINALSYSI DIPSTICK (AUTOMATED)
BILIRUBIN UA: NEGATIVE
GLUCOSE UA: NEGATIVE
KETONES UA: NEGATIVE
Leukocytes, UA: NEGATIVE
Nitrite, UA: NEGATIVE
RBC UA: NEGATIVE
UROBILINOGEN UA: 0.2 U/dL
pH, UA: 6 (ref 5.0–8.0)

## 2017-04-20 LAB — TSH: TSH: 2.76 u[IU]/mL (ref 0.35–4.50)

## 2017-04-20 MED ORDER — PRAMIPEXOLE DIHYDROCHLORIDE 1 MG PO TABS: 1.0000 mg | ORAL_TABLET | Freq: Every day | ORAL | 3 refills | Status: DC

## 2017-04-20 NOTE — Progress Notes (Signed)
   Subjective:    Patient ID: Howard Jacobs, male    DOB: December 02, 1975, 42 y.o.   MRN: [id number]  HPI Here for a well exam. He feels fine. He has lost 18 lbs in the past year. He says the Mirapex has not been working as well lately.    Review of Systems  Constitutional: Negative.   HENT: Negative.   Eyes: Negative.   Respiratory: Negative.   Cardiovascular: Negative.   Gastrointestinal: Negative.   Genitourinary: Negative.   Musculoskeletal: Negative.   Skin: Negative.   Neurological: Negative.   Psychiatric/Behavioral: Negative.        Objective:   Physical Exam  Constitutional: He is oriented to person, place, and time. He appears well-developed and well-nourished. No distress.  HENT:  Head: Normocephalic and atraumatic.  Right Ear: External ear normal.  Left Ear: External ear normal.  Nose: Nose normal.  Mouth/Throat: Oropharynx is clear and moist. No oropharyngeal exudate.  Eyes: Conjunctivae and EOM are normal. Pupils are equal, round, and reactive to light. Right eye exhibits no discharge. Left eye exhibits no discharge. No scleral icterus.  Neck: Neck supple. No JVD present. No tracheal deviation present. No thyromegaly present.  Cardiovascular: Normal rate, regular rhythm, normal heart sounds and intact distal pulses. Exam reveals no gallop and no friction rub.  No murmur heard. Pulmonary/Chest: Effort normal and breath sounds normal. No respiratory distress. He has no wheezes. He has no rales. He exhibits no tenderness.  Abdominal: Soft. Bowel sounds are normal. He exhibits no distension and no mass. There is no tenderness. There is no rebound and no guarding.  Genitourinary: Penis normal. No penile tenderness.  Musculoskeletal: Normal range of motion. He exhibits no edema or tenderness.  Lymphadenopathy:    He has no cervical adenopathy.  Neurological: He is alert and oriented to person, place, and time. He has normal reflexes. No cranial nerve deficit. He exhibits  normal muscle tone. Coordination normal.  Skin: Skin is warm and dry. No rash noted. He is not diaphoretic. No erythema. No pallor.  Psychiatric: He has a normal mood and affect. His behavior is normal. Judgment and thought content normal.          Assessment & Plan:  Well exam. We discussed diet and exercise. Get fasting labs. For the restless legs we will increase Mirapex to 1 mg qhs.  Alysia Penna, MD

## 2017-04-28 ENCOUNTER — Ambulatory Visit: Payer: BLUE CROSS/BLUE SHIELD | Admitting: Sports Medicine

## 2017-05-16 ENCOUNTER — Encounter: Payer: Self-pay | Admitting: Family Medicine

## 2017-05-17 ENCOUNTER — Encounter: Payer: Self-pay | Admitting: Family Medicine

## 2017-05-17 NOTE — Telephone Encounter (Signed)
The forms are ready  

## 2017-05-18 ENCOUNTER — Other Ambulatory Visit: Payer: Self-pay | Admitting: Sports Medicine

## 2017-05-19 ENCOUNTER — Telehealth: Payer: Self-pay

## 2017-05-19 NOTE — Telephone Encounter (Signed)
Called pt to advised he that the forms are done. Pt wanted the forms to be faxed to [phone]. Forms have been faxed and copies have been placed to be scanned into pt's chart.

## 2017-07-25 ENCOUNTER — Encounter: Payer: Self-pay | Admitting: Family Medicine

## 2017-07-26 ENCOUNTER — Other Ambulatory Visit: Payer: Self-pay | Admitting: Surgical

## 2017-07-26 MED ORDER — ACETAZOLAMIDE 250 MG PO TABS: 250.0000 mg | ORAL_TABLET | Freq: Three times a day (TID) | ORAL | 0 refills | Status: DC

## 2017-07-26 NOTE — Telephone Encounter (Signed)
Call in Diamox 250 mg to take TID, #90 with no rf

## 2017-11-03 ENCOUNTER — Other Ambulatory Visit: Payer: Self-pay | Admitting: Family Medicine

## 2017-12-13 ENCOUNTER — Ambulatory Visit: Payer: Self-pay

## 2017-12-13 ENCOUNTER — Encounter: Payer: Self-pay | Admitting: Sports Medicine

## 2017-12-13 ENCOUNTER — Ambulatory Visit: Payer: BLUE CROSS/BLUE SHIELD | Admitting: Sports Medicine

## 2017-12-13 VITALS — BP 132/98 | HR 78 | Ht 71.0 in | Wt 249.0 lb

## 2017-12-13 DIAGNOSIS — M19019 Primary osteoarthritis, unspecified shoulder: Secondary | ICD-10-CM

## 2017-12-13 DIAGNOSIS — M25512 Pain in left shoulder: Secondary | ICD-10-CM

## 2017-12-13 MED ORDER — IBUPROFEN-FAMOTIDINE 800-26.6 MG PO TABS: ORAL_TABLET | ORAL | 1 refills | Status: AC

## 2017-12-13 NOTE — Procedures (Signed)
PROCEDURE NOTE:  Ultrasound Guided: Injection: Left AC Joint Images were obtained and interpreted by myself, Teresa Coombs, DO  Images have been saved and stored to PACS system. Images obtained on: GE S7 Ultrasound machine    ULTRASOUND FINDINGS:  Small mushroom sign  DESCRIPTION OF PROCEDURE:  The patient's clinical condition is marked by substantial pain and/or significant functional disability. Other conservative therapy has not provided relief, is contraindicated, or not appropriate. There is a reasonable likelihood that injection will significantly improve the patient's pain and/or functional impairment.   After discussing the risks, benefits and expected outcomes of the injection and all questions were reviewed and answered, the patient wished to undergo the above named procedure.  Verbal consent was obtained.  The ultrasound was used to identify the target structure and adjacent neurovascular structures. The skin was then prepped in sterile fashion and the target structure was injected under direct visualization using sterile technique as below:  Single injection performed as below: PREP: Alcohol and Ethel Chloride APPROACH:direct, single injection, 25g 1.5 in. INJECTATE: 1 cc 0.5% Marcaine and 1 cc 80mg /mL DepoMedrol ASPIRATE: None DRESSING: Band-Aid  Post procedural instructions including recommending icing and warning signs for infection were reviewed.    This procedure was well tolerated and there were no complications.   IMPRESSION: Succesful Ultrasound Guided: Injection

## 2017-12-13 NOTE — Patient Instructions (Addendum)

## 2017-12-13 NOTE — Progress Notes (Signed)
[name]. Rigby, [place] at [place]  Endy Easterly Roca - 42 y.o. male MRN [id number]  Date of birth: 1975-03-27  Visit Date: 12/13/2017  PCP: Laurey Morale, MD   Referred by: Laurey Morale, MD   Scribe for today's visit: Josepha Pigg, CMA     SUBJECTIVE:  Gaston Islam Nine is here for Follow-up (L shoulder pain)  HPI:  02/10/17:  Compared to the last office visit, his previously described symptoms are improving, aching is not as severe, ROM has improved. Pain still present when lying down at night. He also has been having pain while playing with his sons.  Current symptoms are mild & are nonradiating He has tried taking Ibuprofen and Duexis with some relief. He had intra-articular injection and was provided with home exercises 12/27/2016. He admits that he hasn't been doing exercises as advised. He has also d/c Duexis and Ibuprofen. He has been taking Celebrex 1-2 x daily since his surgery 01/2017.   03/29/17:  Compared to the last office visit, his previously described symptoms are improving but not back to normal. He still has pain when pushing up with the left arm, when turning the steering wheel hard, when pulling shirts over his head, and when reaching into his back pocket. He "messed up" his back about 3 weeks ago and stopped doing his exercises but prior to that he was doing them regularly. The pain is not constant anymore but will has pain with activity.  Current symptoms are moderate stabbing pain & are nonradiating He has been taking Duexis prn for the pain with some relief.  12/13/2017: Compared to the last office visit, his previously described symptoms are worsening. He got good relief with last steroid injection but sx started to flare up over the past 2 months. [place] is [place] and is hoping for another injection prior to moving. He has been doing a lot of physical work recently d/t moving. He also notes  more frequent cracking and popping with movement.  Current symptoms are moderate (7/10) with movement & are radiating to the L arm occasionally.  He has taken Duexis in the past with good relief. He has tried OTC NSAIDs in the past with minimal relief. Good response to steroid inj in the past.  ROS: Reports night time disturbances. Denies fevers, chills, or night sweats. Denies unexplained weight loss. Denies personal history of cancer. Denies changes in bowel or bladder habits. Denies recent unreported falls. Denies new or worsening dyspnea or wheezing. Denies headaches or dizziness.  Denies numbness, tingling or weakness  In the extremities.  Denies dizziness or presyncopal episodes Denies lower extremity edema     HISTORY:  Prior history reviewed and updated per electronic medical record.  Social History   Occupational History  . Not on file  Tobacco Use  . Smoking status: Never Smoker  . Smokeless tobacco: Former Network engineer and Sexual Activity  . Alcohol use: Yes    Alcohol/week: 0.0 standard drinks    Comment: once a month  . Drug use: No  . Sexual activity: Not on file   Social History   Social History Narrative   Occupation: attorney in corporate real estate   Married           [place]:  No results for input(s): HGBA1C, LABURIC, CREATINE in the last 8760 hours. No problems updated. Marland Kitchen Ultrasound-guided injection on March 29, 2017 into  the left AC joint provided significant improvement. .   OBJECTIVE:  VS:  HT:5\' 11"  (180.3 cm)   WT:249 lb (112.9 kg)  BMI:34.74    BP:(!) 132/98  HR:78bpm  TEMP: ( )  RESP:94 %   PHYSICAL EXAM: CONSTITUTIONAL: Well-developed, Well-nourished and In no acute distress PSYCHIATRIC: Alert & appropriately interactive. and Not depressed or anxious appearing. RESPIRATORY: No increased work of breathing and Trachea Midline EYES: Pupils are equal., EOM intact without nystagmus. and No scleral  icterus.  VASCULAR EXAM: Warm and well perfused NEURO: unremarkable  MSK Exam: Left shoulder  Well aligned, no significant deformity. No overlying skin changes. Mild TTP over the left [facility] joint as well as biceps tendon minimal.   RANGE OF MOTION & STRENGTH  Normal, non-painful Abduction, internal rotation, external rotation,   SPECIALITY TESTING:  No significant pain with axial loading circumduction however there is moderate crepitation localizing to the left AC joint.  Negative Hawkins, Neer's, speeds testing.     ASSESSMENT   1. Left shoulder pain, unspecified chronicity   2. AC joint arthropathy     PLAN:  Pertinent additional documentation may be included in corresponding procedure notes, imaging studies, problem based documentation and patient instructions.  Procedures:  . US Guided Injection per procedure note  Medications:  Meds ordered this encounter  Medications  . Ibuprofen-Famotidine (DUEXIS) 800-26.6 MG TABS    Sig: TAKE ONE TABLET BY MOUTH THREE TIMES DAILY FOR 14 DAYS THEN TAKE ONE TABLET BY MOUTH THREE TIMES DAILY AS NEEDED thereafter    Dispense:  90 tablet    Refill:  1   Discussion/Instructions: No problem-specific Assessment & Plan notes found for this encounter.  . Patient is moving to [place].  Recommend further diagnostic evaluation if any lack of improvement but symptoms are most consistent with [facility] joint arthropathy with good improvement with intra-articular injection into the left AC joint previously.  This was repeated today.  Duexis as needed.  Avoid exacerbating activities. .   . Continue previously prescribed home exercise program.  . Discussed red flag symptoms that warrant earlier emergent evaluation and patient voices understanding. . Activity modifications and the importance of avoiding exacerbating activities (limiting pain to no more than a 4 / 10 during or following activity) recommended and discussed.  Follow-up:  . Return if  symptoms worsen or fail to improve.      CMA/ATC served as Education administrator during this visit. History, Physical, and Plan performed by medical provider. Documentation and orders reviewed and attested to.      Gerda Diss, [place] [facility] Physician

## 2018-02-15 ENCOUNTER — Other Ambulatory Visit: Payer: Self-pay | Admitting: Family Medicine

## 2018-02-27 ENCOUNTER — Encounter: Payer: Self-pay | Admitting: Family Medicine

## 2018-11-21 IMAGING — MR MR HEAD W/O CM
9 of 10 series · 35 of 48 positions shown · non-contrast
Comparison: 02/18/2016 CT of the head.

CLINICAL DATA: 40 y/o M; mild left facial droop and right-sided
numbness. History of Bell's palsy.

EXAM:
MRI HEAD WITHOUT CONTRAST
TECHNIQUE: Multiplanar, multiecho pulse sequences of the brain and surrounding
structures were obtained without intravenous contrast.

[Series 3: DWI · axial · 3.0mm · 0.94mm/px · z∈[-51,+85]mm · 8 of 94 slices shown (1 of 2)]
[im 1/94]
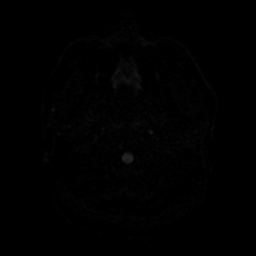
[im 11/94]
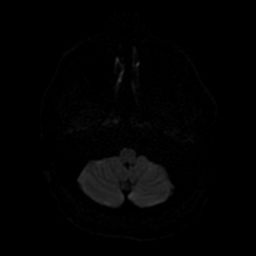
[im 32/94]
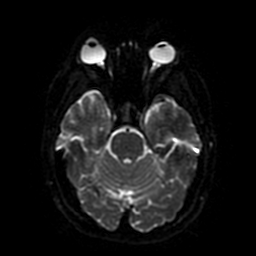
[im 42/94]
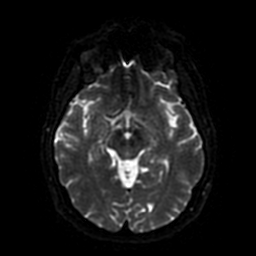
[im 52/94]
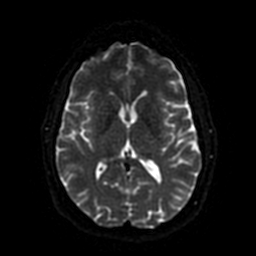
[im 63/94]
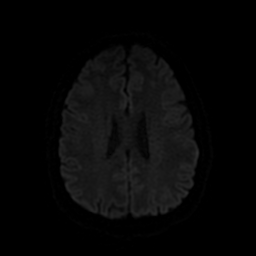
[im 83/94]
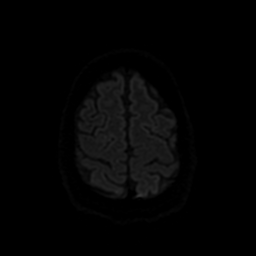
[im 94/94]
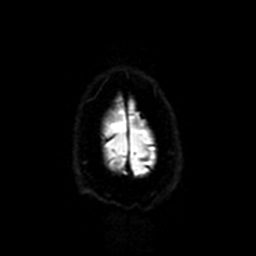

[Series 4: T2 · axial · 5.0mm · 0.47mm/px · z∈[-51,+85]mm · 3 of 24 slices shown (1 of 2)]
[im 1/24]
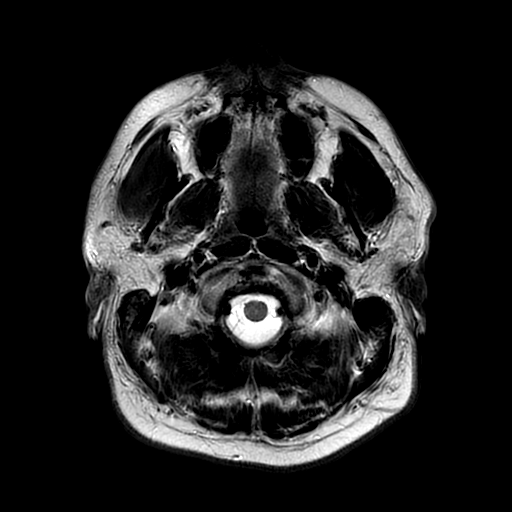
[im 12/24]
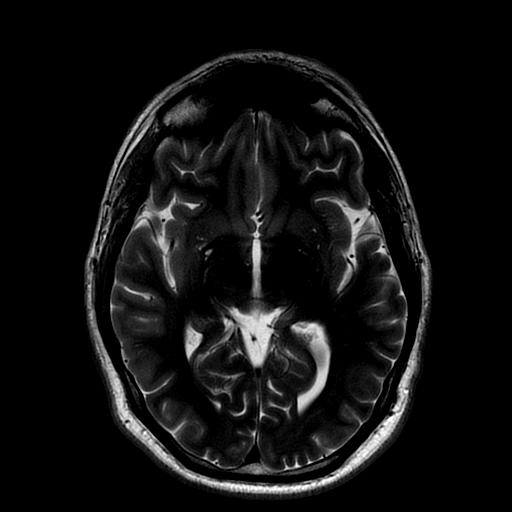
[im 24/24]
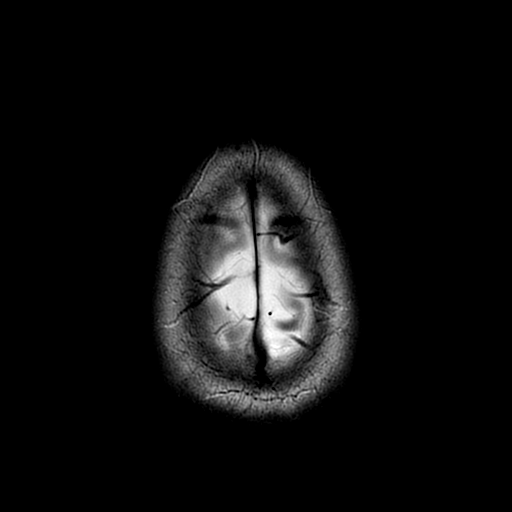

[Series 5: (person_name) · axial · 3.0mm · 0.47mm/px · z∈[-52,-18]mm · 3 of 96 slices shown]
[im 1/96]
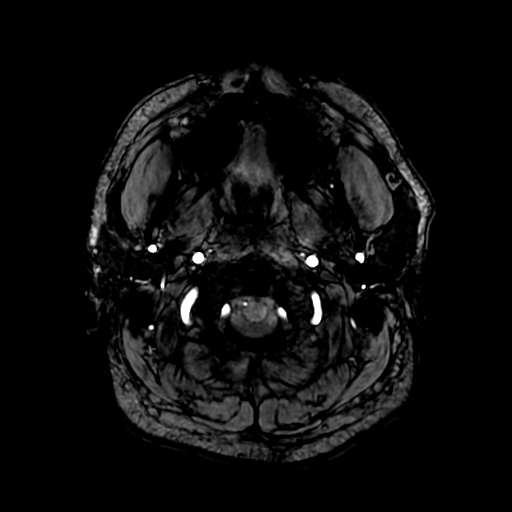
[im 12/96]
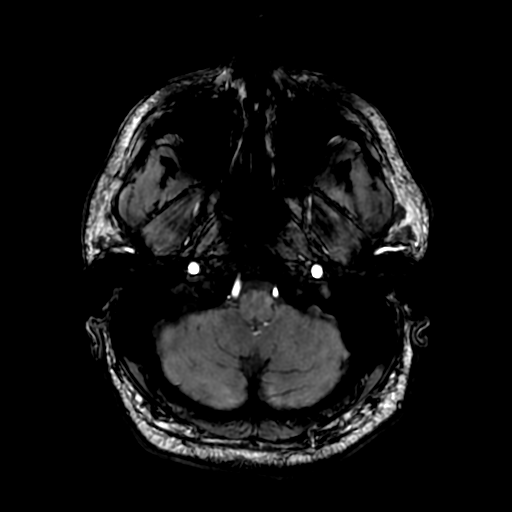
[im 24/96]
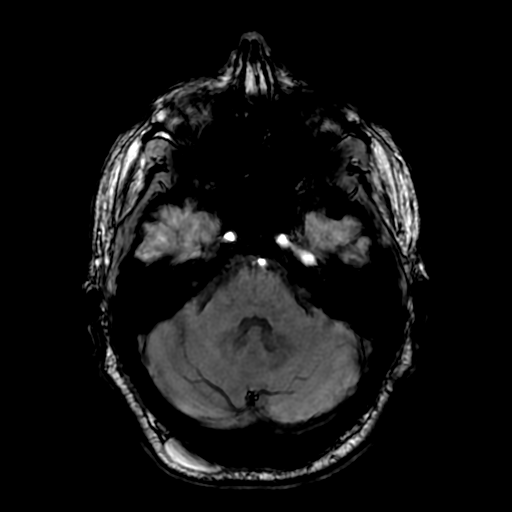

[Series 6: FLAIR · axial · 5.0mm · 0.47mm/px · z∈[-51,+85]mm · 2 of 24 slices shown (1 of 2)]
[im 1/24]
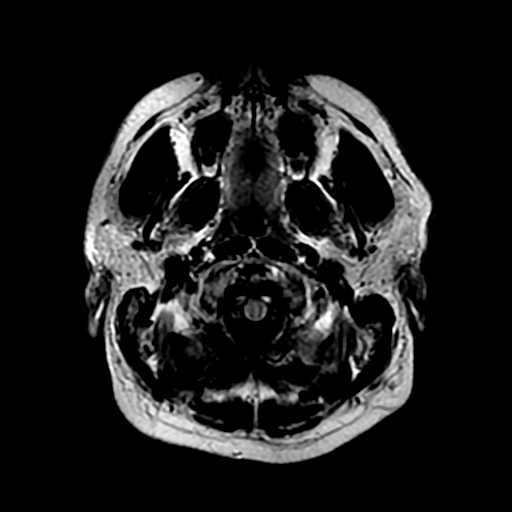
[im 24/24]
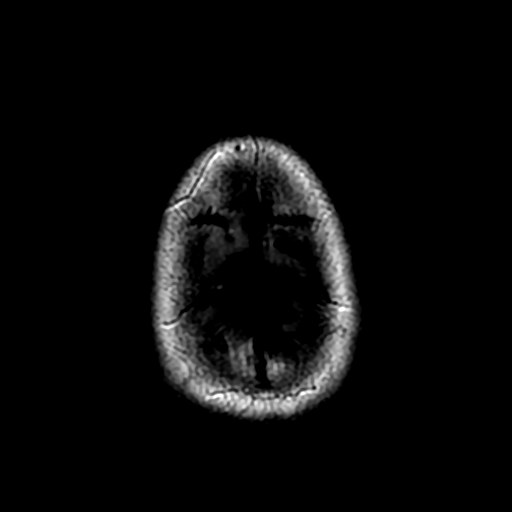

[Series 7: DWI · coronal · 4.0mm · 0.94mm/px · 7 of 70 slices shown (2 of 2)]
[im 1/70]
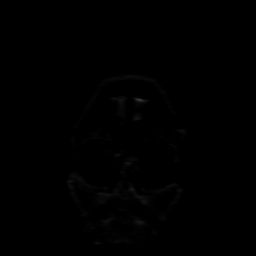
[im 12/70]
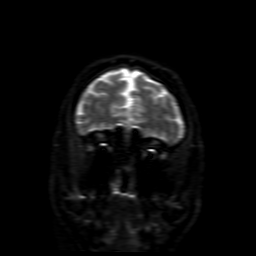
[im 24/70]
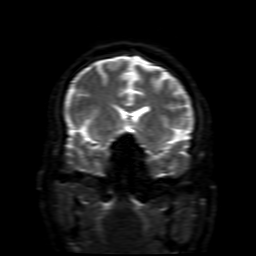
[im 35/70]
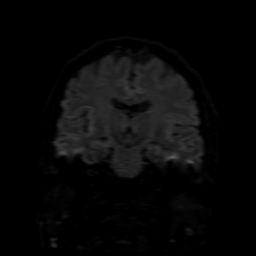
[im 47/70]
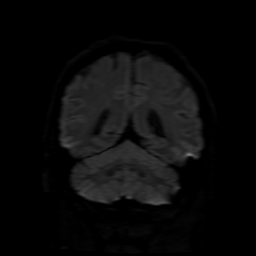
[im 58/70]
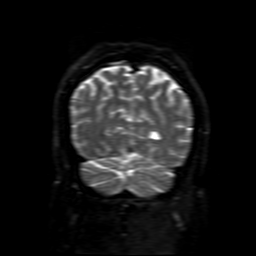
[im 70/70]
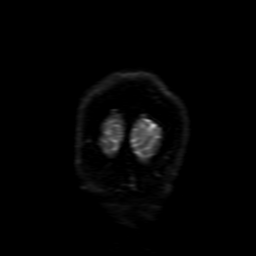

[Series 8: T2 · coronal · 5.0mm · 0.39mm/px · 3 of 29 slices shown (2 of 2)]
[im 1/29]
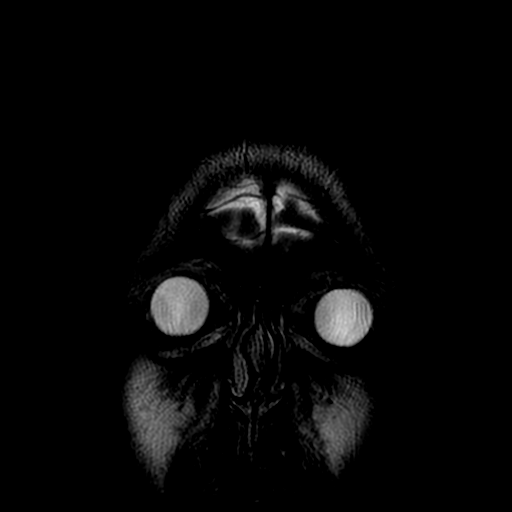
[im 15/29]
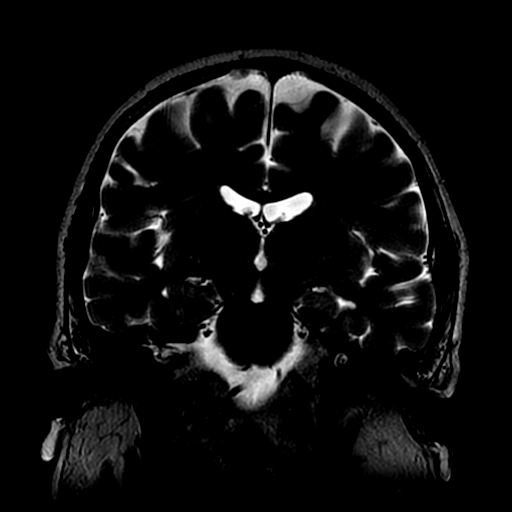
[im 29/29]
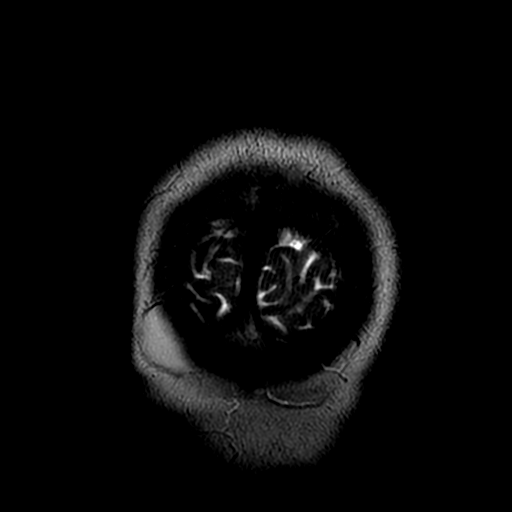

[Series 10: FLAIR · sagittal · 5.0mm · 0.51mm/px · 2 of 25 slices shown (2 of 2)]
[im 1/25]
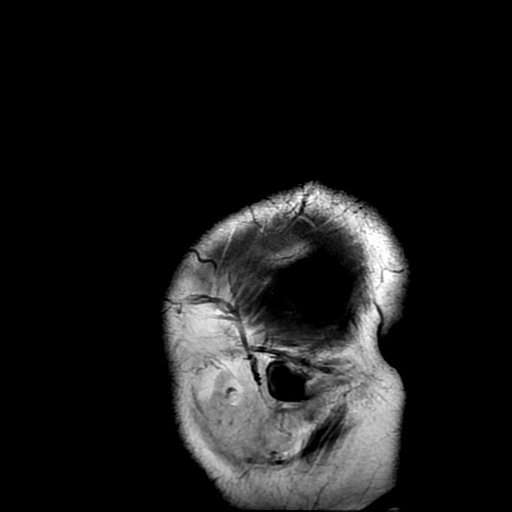
[im 25/25]
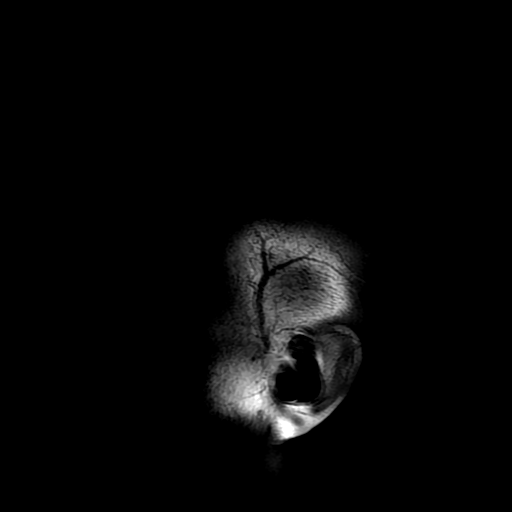

[Series 350: ADC · axial · 3.0mm · 0.94mm/px · z∈[-51,+85]mm · 4 of 45 slices shown (1 of 2)]
[im 1/45]
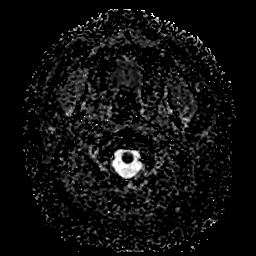
[im 15/45]
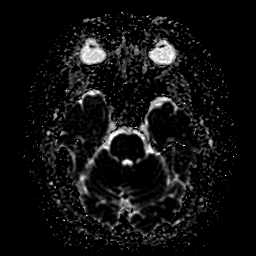
[im 30/45]
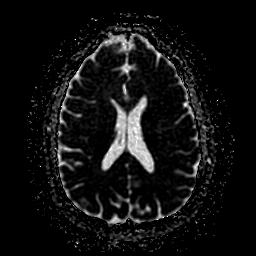
[im 45/45]
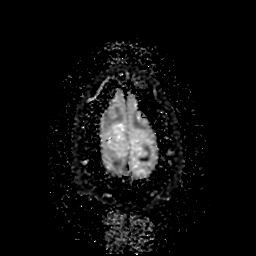

[Series 750: ADC · coronal · 4.0mm · 0.94mm/px · 3 of 35 slices shown (2 of 2)]
[im 1/35]
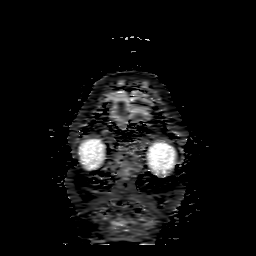
[im 18/35]
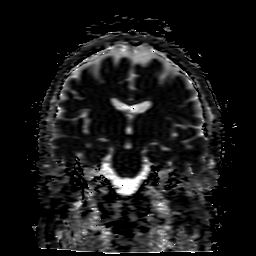
[im 35/35]
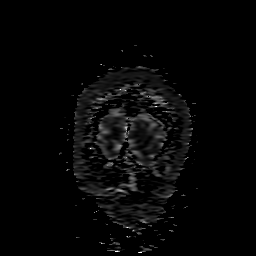

[35 of 48 positions shown; findings below may reference images not displayed]

FINDINGS: Brain: No acute infarction, hemorrhage, hydrocephalus, extra-axial
collection or mass lesion.

Vascular: Normal flow voids.

Skull and upper cervical spine: Normal marrow signal.

Sinuses/Orbits: Negative.

Other: None.
IMPRESSION: No acute intracranial abnormality.  Unremarkable MRI of the brain.

By: Maryri Mejia [place]inc[place] Barre M.D.
